# Patient Record
Sex: Female | Born: 1950 | Race: White | Hispanic: No | Marital: Married | Smoking: Never smoker
Health system: Southern US, Community
[De-identification: ages and names within clinical notes are randomized; demographics above are authoritative.]

## PROBLEM LIST (undated history)

## (undated) DIAGNOSIS — M1990 Unspecified osteoarthritis, unspecified site: Secondary | ICD-10-CM

## (undated) DIAGNOSIS — T8859XA Other complications of anesthesia, initial encounter: Secondary | ICD-10-CM

## (undated) DIAGNOSIS — F32A Depression, unspecified: Secondary | ICD-10-CM

## (undated) DIAGNOSIS — T4145XA Adverse effect of unspecified anesthetic, initial encounter: Secondary | ICD-10-CM

## (undated) DIAGNOSIS — R112 Nausea with vomiting, unspecified: Secondary | ICD-10-CM

## (undated) DIAGNOSIS — I10 Essential (primary) hypertension: Secondary | ICD-10-CM

## (undated) DIAGNOSIS — Z9889 Other specified postprocedural states: Secondary | ICD-10-CM

## (undated) DIAGNOSIS — T7840XA Allergy, unspecified, initial encounter: Secondary | ICD-10-CM

## (undated) DIAGNOSIS — G4700 Insomnia, unspecified: Secondary | ICD-10-CM

## (undated) DIAGNOSIS — G43909 Migraine, unspecified, not intractable, without status migrainosus: Secondary | ICD-10-CM

## (undated) DIAGNOSIS — F419 Anxiety disorder, unspecified: Secondary | ICD-10-CM

## (undated) DIAGNOSIS — M797 Fibromyalgia: Secondary | ICD-10-CM

## (undated) DIAGNOSIS — D696 Thrombocytopenia, unspecified: Secondary | ICD-10-CM

## (undated) DIAGNOSIS — F329 Major depressive disorder, single episode, unspecified: Secondary | ICD-10-CM

## (undated) HISTORY — PX: TONSILLECTOMY: SUR1361

## (undated) HISTORY — DX: Unspecified osteoarthritis, unspecified site: M19.90

## (undated) HISTORY — PX: BREAST ENHANCEMENT SURGERY: SHX7

## (undated) HISTORY — PX: VAGINAL HYSTERECTOMY: SUR661

## (undated) HISTORY — DX: Major depressive disorder, single episode, unspecified: F32.9

## (undated) HISTORY — PX: JOINT REPLACEMENT: SHX530

## (undated) HISTORY — DX: Anxiety disorder, unspecified: F41.9

## (undated) HISTORY — DX: Migraine, unspecified, not intractable, without status migrainosus: G43.909

## (undated) HISTORY — DX: Allergy, unspecified, initial encounter: T78.40XA

## (undated) HISTORY — DX: Depression, unspecified: F32.A

## (undated) HISTORY — PX: ESOPHAGOGASTRODUODENOSCOPY ENDOSCOPY: SHX5814

## (undated) HISTORY — PX: AUGMENTATION MAMMAPLASTY: SUR837

## (undated) HISTORY — DX: Insomnia, unspecified: G47.00

---

## 1989-06-14 HISTORY — PX: AUGMENTATION MAMMAPLASTY: SUR837

## 1998-12-10 ENCOUNTER — Ambulatory Visit (HOSPITAL_COMMUNITY): Admission: RE | Admit: 1998-12-10 | Discharge: 1998-12-10 | Payer: Self-pay | Admitting: Family Medicine

## 1998-12-10 ENCOUNTER — Encounter: Payer: Self-pay | Admitting: Family Medicine

## 1999-08-13 ENCOUNTER — Other Ambulatory Visit: Admission: RE | Admit: 1999-08-13 | Discharge: 1999-08-13 | Payer: Self-pay | Admitting: Obstetrics and Gynecology

## 2000-03-18 ENCOUNTER — Encounter (INDEPENDENT_AMBULATORY_CARE_PROVIDER_SITE_OTHER): Payer: Self-pay | Admitting: Specialist

## 2000-03-18 ENCOUNTER — Ambulatory Visit (HOSPITAL_COMMUNITY): Admission: RE | Admit: 2000-03-18 | Discharge: 2000-03-18 | Payer: Self-pay | Admitting: Gastroenterology

## 2000-11-22 ENCOUNTER — Ambulatory Visit (HOSPITAL_COMMUNITY): Admission: RE | Admit: 2000-11-22 | Discharge: 2000-11-22 | Payer: Self-pay | Admitting: Family Medicine

## 2000-11-22 ENCOUNTER — Other Ambulatory Visit: Admission: RE | Admit: 2000-11-22 | Discharge: 2000-11-22 | Payer: Self-pay | Admitting: Family Medicine

## 2000-11-22 ENCOUNTER — Encounter: Payer: Self-pay | Admitting: Family Medicine

## 2002-02-06 ENCOUNTER — Emergency Department (HOSPITAL_COMMUNITY): Admission: EM | Admit: 2002-02-06 | Discharge: 2002-02-06 | Payer: Self-pay | Admitting: *Deleted

## 2002-07-18 ENCOUNTER — Encounter: Admission: RE | Admit: 2002-07-18 | Discharge: 2002-07-18 | Payer: Self-pay | Admitting: *Deleted

## 2004-02-18 DIAGNOSIS — K5730 Diverticulosis of large intestine without perforation or abscess without bleeding: Secondary | ICD-10-CM | POA: Insufficient documentation

## 2004-02-18 HISTORY — PX: COLONOSCOPY: SHX174

## 2004-06-12 ENCOUNTER — Ambulatory Visit: Payer: Self-pay | Admitting: Family Medicine

## 2006-05-09 ENCOUNTER — Other Ambulatory Visit: Admission: RE | Admit: 2006-05-09 | Discharge: 2006-05-09 | Payer: Self-pay | Admitting: *Deleted

## 2007-03-24 ENCOUNTER — Ambulatory Visit: Payer: Self-pay | Admitting: Gastroenterology

## 2007-04-07 ENCOUNTER — Ambulatory Visit: Payer: Self-pay | Admitting: Gastroenterology

## 2007-04-07 DIAGNOSIS — K222 Esophageal obstruction: Secondary | ICD-10-CM

## 2007-04-07 DIAGNOSIS — K229 Disease of esophagus, unspecified: Secondary | ICD-10-CM | POA: Insufficient documentation

## 2007-08-24 DIAGNOSIS — K259 Gastric ulcer, unspecified as acute or chronic, without hemorrhage or perforation: Secondary | ICD-10-CM | POA: Insufficient documentation

## 2008-01-23 ENCOUNTER — Encounter: Admission: RE | Admit: 2008-01-23 | Discharge: 2008-01-23 | Payer: Self-pay | Admitting: Family Medicine

## 2008-08-30 ENCOUNTER — Encounter: Admission: RE | Admit: 2008-08-30 | Discharge: 2008-08-30 | Payer: Self-pay | Admitting: Family Medicine

## 2009-04-03 ENCOUNTER — Encounter: Admission: RE | Admit: 2009-04-03 | Discharge: 2009-04-03 | Payer: Self-pay | Admitting: Family Medicine

## 2010-10-27 NOTE — Assessment & Plan Note (Signed)
[facility]                         GASTROENTEROLOGY OFFICE NOTE   NAME:Laura Sanders, Laura Sanders                          MRN:          [id number]  DATE:03/24/2007                            DOB:          [birth year]    REFERRING PHYSICIAN:  [name]. Artis Flock, M.D.   REASON FOR CONSULTATION:  Dysphagia.   Laura Sanders is a pleasant, 60 year old white female, referred through the  courtesy of Dr. Artis Flock for evaluation.  For the last six months, she has  noted dysphagia to solids.  She feels like her throat tightens up and  she has some discomfort with swallowing.  She is also complaining of  pyrosis.  She is taking Tums and Rolaids with relief.   PAST MEDICAL HISTORY:  Remarkable for diverticulosis.  She is status  post hysterectomy and appendectomy.   FAMILY HISTORY:  Positive for a sister with colon polyps.   MEDICATIONS INCLUDE:  Xanax, Allegra and Celexa.   She has no allergies.   She neither smokes nor drinks.  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   REVIEW OF SYSTEMS:  Positive for some fatigue.   PHYSICAL EXAMINATION:  Pulse 72, blood pressure 98/62, weight 134.  HEENT: EOMI.  PERRLA.  Sclerae are anicteric.  Conjunctivae are pink.  NECK:  Supple without thyromegaly, adenopathy or carotid bruits.  CHEST:  Clear to auscultation and percussion without adventitious  sounds.  CARDIAC:  Regular rhythm; normal S1 S2.  There are no murmurs, gallops  or rubs.  ABDOMEN:  Bowel sounds are normoactive.  Abdomen is soft, nontender and  nondistended.  There are no abdominal masses, tenderness, splenic  enlargement or hepatomegaly.  EXTREMITIES:  Full range of motion.  No cyanosis, clubbing or edema.  RECTAL:  Deferred.   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   RECOMMENDATION:  1. Begin Protonix 40 mg a day.  2. Upper endoscopy with dilatation as indicated.     Barbette Hair. Arlyce Dice, MD,FACG  Electronically Signed    RDK/MedQ   DD: 03/24/2007  DT: 03/24/2007  Job #: [id number]   cc:   [name]. Artis Flock, M.D.

## 2010-10-27 NOTE — Letter (Signed)
March 24, 2007    [name]. Artis Flock, M.D.  [address]  [place], [place]. [address]   RE:  HEBA, IGE  MRN:  [id number]  /  DOB:  August 23, 1950   Dear Dr. Artis Flock:   Upon your kind referral, I had the pleasure of evaluating your patient  and I am pleased to offer my findings.  I saw Laura Sanders in the office  today.  Enclosed is a copy of my progress note that details my findings  and recommendations.   Thank you for the opportunity to participate in your patient's care.    Sincerely,      Barbette Hair. Arlyce Dice, MD,FACG  Electronically Signed    RDK/MedQ  DD: 03/24/2007  DT: 03/24/2007  Job #: [id number]

## 2010-10-30 NOTE — Procedures (Signed)
[facility]  Patient:    Laura Sanders, Laura Sanders                        MRN: [id number] Proc. Date: 03/18/00 Adm. Date:  [id number] Attending:  Judeth Cornfield                           Procedure Report  PROCEDURE:  Upper endoscopy.  SURGEON:  Barbette Hair. Arlyce Dice, M.D.  INDICATIONS:  The patient has had persistent severe mid epigastric pain with radiation to both sides.  She is on no gastric irritants.  History is notable for a superficial gastric ulcer diagnosed in 1998.  She is not improved with empiric therapy with Axid.  INFORMED CONSENT:  The patient provided consent after risks, benefits, and alternatives were explained.  MEDICATIONS:  Versed 7, fentanyl 75, and Robinul 0.2 mg IV, and Cetacaine spray.  DESCRIPTION OF PROCEDURE:  The patient was placed in the left lateral decubitus position, administered continuous low flow oxygen, and was placed on pulse oximetry.  The Olympus video gastroscope was inserted under direct vision into the oropharynx and esophagus.  FINDINGS: 1. In the gastric antrum, there were multiple superficial erosions.  Biopsies    were taken.  Proximal stomach was normal. 2. Normal esophagus and duodenum.  IMPRESSION:  Erosive gastritis.  RECOMMENDATIONS:  Discontinue Axid.  Begin Protonix 40 mg a day. DD:  03/18/00 TD:  03/19/00 Job: [id number]

## 2010-11-11 ENCOUNTER — Other Ambulatory Visit (HOSPITAL_COMMUNITY): Payer: Self-pay | Admitting: Orthopedic Surgery

## 2010-11-11 DIAGNOSIS — M542 Cervicalgia: Secondary | ICD-10-CM

## 2010-11-12 ENCOUNTER — Ambulatory Visit (HOSPITAL_COMMUNITY)
Admission: RE | Admit: 2010-11-12 | Discharge: 2010-11-12 | Disposition: A | Discharge status: Home / Self Care | Payer: No Typology Code available for payment source | Source: Ambulatory Visit | Attending: Orthopedic Surgery | Admitting: Orthopedic Surgery

## 2010-11-12 DIAGNOSIS — M47812 Spondylosis without myelopathy or radiculopathy, cervical region: Secondary | ICD-10-CM | POA: Insufficient documentation

## 2010-11-12 DIAGNOSIS — M25519 Pain in unspecified shoulder: Secondary | ICD-10-CM | POA: Insufficient documentation

## 2010-11-12 DIAGNOSIS — M5090 Cervical disc disorder, unspecified, unspecified cervical region: Secondary | ICD-10-CM | POA: Insufficient documentation

## 2010-11-12 DIAGNOSIS — M5124 Other intervertebral disc displacement, thoracic region: Secondary | ICD-10-CM | POA: Insufficient documentation

## 2010-11-12 DIAGNOSIS — M542 Cervicalgia: Secondary | ICD-10-CM

## 2010-11-20 ENCOUNTER — Institutional Professional Consult (permissible substitution): Payer: Self-pay | Admitting: Internal Medicine

## 2011-02-24 ENCOUNTER — Encounter: Payer: Self-pay | Admitting: Gastroenterology

## 2011-08-20 ENCOUNTER — Emergency Department: Payer: Self-pay | Admitting: Emergency Medicine

## 2011-08-20 LAB — URINALYSIS, COMPLETE
Blood: NEGATIVE
Ketone: NEGATIVE
Ph: 7 (ref 4.5–8.0)
Protein: NEGATIVE
RBC,UR: 1 /HPF (ref 0–5)

## 2011-08-20 LAB — COMPREHENSIVE METABOLIC PANEL
Anion Gap: 9 (ref 7–16)
Bilirubin,Total: 0.6 mg/dL (ref 0.2–1.0)
Chloride: 102 mmol/L (ref 98–107)
Co2: 27 mmol/L (ref 21–32)
Creatinine: 0.81 mg/dL (ref 0.60–1.30)
EGFR (African American): >60
EGFR (Non-African Amer.): >60
Osmolality: 274 (ref 275–301)
Potassium: 3.7 mmol/L (ref 3.5–5.1)
SGPT (ALT): 16 U/L
Sodium: 138 mmol/L (ref 136–145)
Total Protein: 7.4 g/dL (ref 6.4–8.2)

## 2011-08-20 LAB — CBC
HCT: 39.9 % (ref 35.0–47.0)
HGB: 13.6 g/dL (ref 12.0–16.0)
MCH: 30.7 pg (ref 26.0–34.0)
MCHC: 34.1 g/dL (ref 32.0–36.0)
MCV: 90 fL (ref 80–100)
RBC: 4.43 10*6/uL (ref 3.80–5.20)

## 2011-08-20 LAB — DRUG SCREEN, URINE
Amphetamines, Ur Screen: NEGATIVE (ref ?–1000)
Benzodiazepine, Ur Scrn: NEGATIVE (ref ?–200)
Methadone, Ur Screen: NEGATIVE (ref ?–300)
Phencyclidine (PCP) Ur S: NEGATIVE (ref ?–25)

## 2011-08-20 LAB — CK TOTAL AND CKMB (NOT AT ARMC): CK-MB: <0.5 ng/mL — ABNORMAL LOW (ref 0.5–3.6)

## 2011-08-20 LAB — TROPONIN I: Troponin-I: 0.03 ng/mL

## 2011-09-02 ENCOUNTER — Other Ambulatory Visit: Payer: Self-pay | Admitting: Unknown Physician Specialty

## 2011-09-02 DIAGNOSIS — Z1231 Encounter for screening mammogram for malignant neoplasm of breast: Secondary | ICD-10-CM

## 2011-09-15 ENCOUNTER — Ambulatory Visit: Payer: No Typology Code available for payment source

## 2012-03-09 ENCOUNTER — Encounter: Payer: Self-pay | Admitting: Gastroenterology

## 2012-12-07 ENCOUNTER — Other Ambulatory Visit: Payer: Self-pay

## 2012-12-11 ENCOUNTER — Other Ambulatory Visit: Payer: Self-pay

## 2012-12-11 DIAGNOSIS — Z1231 Encounter for screening mammogram for malignant neoplasm of breast: Secondary | ICD-10-CM

## 2013-01-18 ENCOUNTER — Ambulatory Visit
Admission: RE | Admit: 2013-01-18 | Discharge: 2013-01-18 | Disposition: A | Discharge status: Home / Self Care | Payer: No Typology Code available for payment source | Source: Ambulatory Visit

## 2013-01-18 DIAGNOSIS — Z1231 Encounter for screening mammogram for malignant neoplasm of breast: Secondary | ICD-10-CM

## 2013-01-24 ENCOUNTER — Other Ambulatory Visit: Payer: Self-pay | Admitting: Family Medicine

## 2013-01-24 DIAGNOSIS — N644 Mastodynia: Secondary | ICD-10-CM

## 2013-01-29 ENCOUNTER — Encounter: Payer: Self-pay | Admitting: Gastroenterology

## 2013-03-07 ENCOUNTER — Ambulatory Visit
Admission: RE | Admit: 2013-03-07 | Discharge: 2013-03-07 | Disposition: A | Discharge status: Home / Self Care | Payer: No Typology Code available for payment source | Source: Ambulatory Visit | Attending: Family Medicine | Admitting: Family Medicine

## 2013-03-07 ENCOUNTER — Other Ambulatory Visit: Payer: Self-pay | Admitting: Family Medicine

## 2013-03-07 DIAGNOSIS — N644 Mastodynia: Secondary | ICD-10-CM

## 2013-03-15 ENCOUNTER — Ambulatory Visit
Admission: RE | Admit: 2013-03-15 | Discharge: 2013-03-15 | Disposition: A | Discharge status: Home / Self Care | Payer: No Typology Code available for payment source | Source: Ambulatory Visit | Attending: Family Medicine | Admitting: Family Medicine

## 2013-03-15 ENCOUNTER — Other Ambulatory Visit: Payer: Self-pay | Admitting: Family Medicine

## 2013-03-15 DIAGNOSIS — N644 Mastodynia: Secondary | ICD-10-CM

## 2013-03-15 HISTORY — PX: BREAST BIOPSY: SHX20

## 2013-04-04 ENCOUNTER — Telehealth: Payer: Self-pay | Admitting: *Deleted

## 2013-04-04 NOTE — Telephone Encounter (Signed)
Pts last colonoscopy was on 02/18/2004, Diverticulosis was the only things noted, recall around 2012 on report.The recall assessment sheet states recall in 2015. Pt is scheduled for colonoscopy on 04/20/13. Is it okay to do colonoscopy now or does pt need to wait til next year (2015)? Please advise-adm

## 2013-04-05 NOTE — Telephone Encounter (Signed)
Pt notified that recall colon not due until 2015.  Colonoscopy scheduled for 11/7 with Dr. Arlyce Dice cancelled.  Recall for 2015 entered into computer.

## 2013-04-05 NOTE — Telephone Encounter (Signed)
2015  

## 2013-04-20 ENCOUNTER — Encounter: Payer: No Typology Code available for payment source | Admitting: Gastroenterology

## 2013-12-25 ENCOUNTER — Encounter: Payer: Self-pay | Admitting: Gastroenterology

## 2014-02-15 ENCOUNTER — Ambulatory Visit: Payer: Self-pay | Admitting: Internal Medicine

## 2014-02-27 DIAGNOSIS — M542 Cervicalgia: Secondary | ICD-10-CM | POA: Insufficient documentation

## 2014-05-08 ENCOUNTER — Other Ambulatory Visit: Payer: Self-pay | Admitting: Internal Medicine

## 2014-05-08 DIAGNOSIS — N6320 Unspecified lump in the left breast, unspecified quadrant: Secondary | ICD-10-CM

## 2014-05-28 ENCOUNTER — Other Ambulatory Visit: Payer: Self-pay

## 2014-05-28 DIAGNOSIS — Z1231 Encounter for screening mammogram for malignant neoplasm of breast: Secondary | ICD-10-CM

## 2014-07-02 ENCOUNTER — Ambulatory Visit
Admission: RE | Admit: 2014-07-02 | Discharge: 2014-07-02 | Disposition: A | Discharge status: Home / Self Care | Payer: No Typology Code available for payment source | Source: Ambulatory Visit

## 2014-07-02 DIAGNOSIS — Z1231 Encounter for screening mammogram for malignant neoplasm of breast: Secondary | ICD-10-CM

## 2014-09-15 ENCOUNTER — Encounter: Payer: Self-pay | Admitting: Gastroenterology

## 2015-04-15 ENCOUNTER — Other Ambulatory Visit: Payer: Self-pay | Admitting: Internal Medicine

## 2015-04-15 DIAGNOSIS — R1084 Generalized abdominal pain: Secondary | ICD-10-CM

## 2015-04-15 DIAGNOSIS — E7800 Pure hypercholesterolemia, unspecified: Secondary | ICD-10-CM | POA: Insufficient documentation

## 2015-04-15 DIAGNOSIS — D696 Thrombocytopenia, unspecified: Secondary | ICD-10-CM | POA: Insufficient documentation

## 2015-04-15 DIAGNOSIS — Z1231 Encounter for screening mammogram for malignant neoplasm of breast: Secondary | ICD-10-CM

## 2015-04-21 ENCOUNTER — Ambulatory Visit
Admission: RE | Admit: 2015-04-21 | Discharge: 2015-04-21 | Disposition: A | Discharge status: Home / Self Care | Payer: No Typology Code available for payment source | Source: Ambulatory Visit | Attending: Internal Medicine | Admitting: Internal Medicine

## 2015-04-21 DIAGNOSIS — R1084 Generalized abdominal pain: Secondary | ICD-10-CM

## 2015-04-24 ENCOUNTER — Ambulatory Visit (AMBULATORY_SURGERY_CENTER): Payer: Self-pay | Admitting: *Deleted

## 2015-04-24 VITALS — Ht 64.0 in | Wt 142.2 lb

## 2015-04-24 DIAGNOSIS — Z1211 Encounter for screening for malignant neoplasm of colon: Secondary | ICD-10-CM

## 2015-04-24 MED ORDER — SUPREP BOWEL PREP KIT 17.5-3.13-1.6 GM/177ML PO SOLN: 1.0000 | Freq: Once | ORAL | Status: DC

## 2015-04-24 NOTE — Progress Notes (Signed)
Patient denies any allergies to egg or soy products. Patient denies complications with anesthesia/sedation.  Patient denies oxygen use at home and denies diet medications. Emmi instructions for colonoscopy explained but patient refused.   

## 2015-04-25 ENCOUNTER — Inpatient Hospital Stay: Payer: No Typology Code available for payment source | Attending: Oncology | Admitting: Oncology

## 2015-04-25 ENCOUNTER — Inpatient Hospital Stay: Payer: No Typology Code available for payment source

## 2015-04-25 VITALS — BP 137/74 | HR 73 | Temp 96.8°F | Resp 20 | Wt 143.3 lb

## 2015-04-25 DIAGNOSIS — D696 Thrombocytopenia, unspecified: Secondary | ICD-10-CM | POA: Diagnosis not present

## 2015-04-25 DIAGNOSIS — Z79899 Other long term (current) drug therapy: Secondary | ICD-10-CM | POA: Diagnosis not present

## 2015-04-25 DIAGNOSIS — F418 Other specified anxiety disorders: Secondary | ICD-10-CM | POA: Diagnosis not present

## 2015-04-25 DIAGNOSIS — M170 Bilateral primary osteoarthritis of knee: Secondary | ICD-10-CM | POA: Diagnosis not present

## 2015-04-25 DIAGNOSIS — M19042 Primary osteoarthritis, left hand: Secondary | ICD-10-CM | POA: Diagnosis not present

## 2015-04-25 DIAGNOSIS — R109 Unspecified abdominal pain: Secondary | ICD-10-CM | POA: Diagnosis not present

## 2015-04-25 DIAGNOSIS — M19041 Primary osteoarthritis, right hand: Secondary | ICD-10-CM

## 2015-04-25 LAB — LACTATE DEHYDROGENASE: LDH: 131 U/L (ref 98–192)

## 2015-04-25 LAB — FERRITIN: Ferritin: 33 ng/mL (ref 11–307)

## 2015-04-25 LAB — IRON AND TIBC
Iron: 93 ug/dL (ref 28–170)
SATURATION RATIOS: 28 % (ref 10.4–31.8)
TIBC: 336 ug/dL (ref 250–450)
UIBC: 243 ug/dL

## 2015-04-25 LAB — FOLATE: Folate: 16.2 ng/mL (ref >5.9)

## 2015-04-25 NOTE — Progress Notes (Signed)
Patient here today for initial evaluation regarding thrombocytopenia, referred by Dr. Judithann SheenSparks.

## 2015-04-26 LAB — ANA W/REFLEX: Anti Nuclear Antibody(ANA): NEGATIVE

## 2015-04-26 LAB — PLATELET ANTIBODY PROFILE, SERUM
HLA Ab Ser Ql EIA: NEGATIVE
IA/IIA ANTIBODY: NEGATIVE
IB/IX ANTIBODY: NEGATIVE
IIB/IIIA Antibody: NEGATIVE

## 2015-04-26 LAB — VITAMIN B12: Vitamin B-12: 266 pg/mL (ref 180–914)

## 2015-04-28 LAB — PROTEIN ELECTROPHORESIS, SERUM
A/G RATIO SPE: 1.1 (ref 0.7–1.7)
ALBUMIN ELP: 3.6 g/dL (ref 2.9–4.4)
Alpha-1-Globulin: 0.3 g/dL (ref 0.0–0.4)
Alpha-2-Globulin: 0.7 g/dL (ref 0.4–1.0)
Beta Globulin: 1.1 g/dL (ref 0.7–1.3)
GAMMA GLOBULIN: 1.2 g/dL (ref 0.4–1.8)
Globulin, Total: 3.3 g/dL (ref 2.2–3.9)
TOTAL PROTEIN ELP: 6.9 g/dL (ref 6.0–8.5)

## 2015-05-01 ENCOUNTER — Encounter: Payer: Self-pay | Admitting: Gastroenterology

## 2015-05-07 ENCOUNTER — Ambulatory Visit (AMBULATORY_SURGERY_CENTER): Payer: No Typology Code available for payment source | Admitting: Gastroenterology

## 2015-05-07 ENCOUNTER — Encounter: Payer: Self-pay | Admitting: Gastroenterology

## 2015-05-07 VITALS — BP 154/67 | HR 74 | Temp 97.0°F | Resp 14 | Ht 64.0 in | Wt 142.0 lb

## 2015-05-07 DIAGNOSIS — F329 Major depressive disorder, single episode, unspecified: Secondary | ICD-10-CM | POA: Insufficient documentation

## 2015-05-07 DIAGNOSIS — M1710 Unilateral primary osteoarthritis, unspecified knee: Secondary | ICD-10-CM | POA: Insufficient documentation

## 2015-05-07 DIAGNOSIS — F32A Depression, unspecified: Secondary | ICD-10-CM | POA: Insufficient documentation

## 2015-05-07 DIAGNOSIS — Z1211 Encounter for screening for malignant neoplasm of colon: Secondary | ICD-10-CM

## 2015-05-07 DIAGNOSIS — M1711 Unilateral primary osteoarthritis, right knee: Secondary | ICD-10-CM | POA: Insufficient documentation

## 2015-05-07 DIAGNOSIS — N3010 Interstitial cystitis (chronic) without hematuria: Secondary | ICD-10-CM | POA: Insufficient documentation

## 2015-05-07 DIAGNOSIS — Z8711 Personal history of peptic ulcer disease: Secondary | ICD-10-CM | POA: Insufficient documentation

## 2015-05-07 DIAGNOSIS — K6389 Other specified diseases of intestine: Secondary | ICD-10-CM | POA: Diagnosis not present

## 2015-05-07 DIAGNOSIS — Z8719 Personal history of other diseases of the digestive system: Secondary | ICD-10-CM | POA: Insufficient documentation

## 2015-05-07 DIAGNOSIS — Z8669 Personal history of other diseases of the nervous system and sense organs: Secondary | ICD-10-CM | POA: Insufficient documentation

## 2015-05-07 MED ORDER — SODIUM CHLORIDE 0.9 % IV SOLN: 500.0000 mL | INTRAVENOUS | Status: DC

## 2015-05-07 NOTE — Progress Notes (Signed)
A/ox3 pleased with MAC, report to Wendy RN 

## 2015-05-07 NOTE — Op Note (Signed)
[facility] [address] [place], [id number]   COLONOSCOPY PROCEDURE REPORT  PATIENT: Marcelino DusterJones, Fatime  MR#: [id number] BIRTHDATE: Jan 31, 1951 , 64  yrs. old GENDER: female ENDOSCOPIST: Marsa ArisKavitha Pearlee Arvizu, MD REFERRED XB:[id number]:Jeffrey Sparks, M.D. PROCEDURE DATE:  05/07/2015 PROCEDURE:   Colonoscopy, screening First Screening Colonoscopy - Avg.  risk and is 50 yrs.  old or older - No.  Prior Negative Screening - Now for repeat screening. 10 or more years since last screening  History of Adenoma - Now for follow-up colonoscopy & has been > or = to 3 yrs.  N/A  Polyps removed today? No ASA CLASS:   Class II INDICATIONS:Screening for colonic neoplasia and Colorectal Neoplasm Risk Assessment for this procedure is average risk. MEDICATIONS: Propofol 300 mg IV  DESCRIPTION OF PROCEDURE:   After the risks benefits and alternatives of the procedure were thoroughly explained, informed consent was obtained.  The digital rectal exam revealed no abnormalities of the rectum.   The LB [id number]-H190 [id number]  endoscope was introduced through the anus and advanced to the terminal ileum which was intubated for a short distance. No adverse events experienced.   The quality of the prep was good.  The instrument was then slowly withdrawn as the colon was fully examined. Estimated blood loss is zero unless otherwise noted in this procedure report.   COLON FINDINGS: The examined terminal ileum appeared to be normal. There was moderate diverticulosis noted in the sigmoid colon with associated petechiae and muscular hypertrophy.  Retroflexion was performed, due to a narrow rectal vault did not get a good retroflexed view. The time to cecum = 12.2 Withdrawal time = 9.8 The scope was withdrawn and the procedure completed. COMPLICATIONS: There were no immediate complications.  ENDOSCOPIC IMPRESSION: 1.   The examined terminal ileum appeared to be normal 2.   There was moderate diverticulosis noted in  the sigmoid colon  RECOMMENDATIONS: 1.  You should continue to follow colorectal cancer screening guidelines for "routine risk" patients with a repeat colonoscopy in 10 years.  There is no need for FOBT (stool) testing for at least 5 years. 2.  Await biopsy results  eSigned:  Marsa ArisKavitha Janella Rogala, MD 05/07/2015 9:38 AM

## 2015-05-07 NOTE — Patient Instructions (Signed)
YOU HAD AN ENDOSCOPIC PROCEDURE TODAY AT THE [facility]:   Refer to the procedure report that was given to you for any specific questions about what was found during the examination.  If the procedure report does not answer your questions, please call your gastroenterologist to clarify.  If you requested that your care partner not be given the details of your procedure findings, then the procedure report has been included in a sealed envelope for you to review at your convenience later.  YOU SHOULD EXPECT: Some feelings of bloating in the abdomen. Passage of more gas than usual.  Walking can help get rid of the air that was put into your GI tract during the procedure and reduce the bloating. If you had a lower endoscopy (such as a colonoscopy or flexible sigmoidoscopy) you may notice spotting of blood in your stool or on the toilet paper. If you underwent a bowel prep for your procedure, you may not have a normal bowel movement for a few days.  Please Note:  You might notice some irritation and congestion in your nose or some drainage.  This is from the oxygen used during your procedure.  There is no need for concern and it should clear up in a day or so.  SYMPTOMS TO REPORT IMMEDIATELY:   Following lower endoscopy (colonoscopy or flexible sigmoidoscopy):  Excessive amounts of blood in the stool  Significant tenderness or worsening of abdominal pains  Swelling of the abdomen that is new, acute  Fever of 100F or higher  For urgent or emergent issues, a gastroenterologist can be reached at any hour by calling [phone].   DIET: Your first meal following the procedure should be a small meal and then it is ok to progress to your normal diet. Heavy or fried foods are harder to digest and may make you feel nauseous or bloated.  Likewise, meals heavy in dairy and vegetables can increase bloating.  Drink plenty of fluids but you should avoid alcoholic beverages for 24  hours.  ACTIVITY:  You should plan to take it easy for the rest of today and you should NOT DRIVE or use heavy machinery until tomorrow (because of the sedation medicines used during the test).    FOLLOW UP: Our staff will call the number listed on your records the next business day following your procedure to check on you and address any questions or concerns that you may have regarding the information given to you following your procedure. If we do not reach you, we will leave a message.  However, if you are feeling well and you are not experiencing any problems, there is no need to return our call.  We will assume that you have returned to your regular daily activities without incident.  If any biopsies were taken you will be contacted by phone or by letter within the next 1-3 weeks.  Please call us at [phone] if you have not heard about the biopsies in 3 weeks.    SIGNATURES/CONFIDENTIALITY: You and/or your care partner have signed paperwork which will be entered into your electronic medical record.  These signatures attest to the fact that that the information above on your After Visit Summary has been reviewed and is understood.  Full responsibility of the confidentiality of this discharge information lies with you and/or your care-partner.  Await biopsy results. Next colonoscopy in 10 years. Please review diverticulosis and high fiber diet handouts provided.

## 2015-05-07 NOTE — Progress Notes (Signed)
Called to room to assist during endoscopic procedure.  Patient ID and intended procedure confirmed with present staff. Received instructions for my participation in the procedure from the performing physician.  

## 2015-05-07 NOTE — Progress Notes (Signed)
Patient denies any allergies to eggs or soy. 

## 2015-05-12 ENCOUNTER — Telehealth: Payer: Self-pay | Admitting: *Deleted

## 2015-05-12 NOTE — Telephone Encounter (Signed)
  Follow up Call-  Call back number 05/07/2015  Post procedure Call Back phone  # [phone]  Permission to leave phone message Yes     Patient questions:  Do you have a fever, pain , or abdominal swelling? No. Pain Score  0 *  Have you tolerated food without any problems? Yes.    Have you been able to return to your normal activities? Yes.    Do you have any questions about your discharge instructions: Diet   No. Medications  No. Follow up visit  No.  Do you have questions or concerns about your Care? No.  Actions: * If pain score is 4 or above: No action needed, pain <4.

## 2015-05-13 NOTE — Progress Notes (Signed)
[place]  [phone] [phone]) [phone]  ID: Laura Sanders OB: [birth year]  MR#: [id number]  NOB#:[id number]  Patient Care Team: Idelle Crouch, MD as PCP - General (Internal Medicine)  CHIEF COMPLAINT:  Chief Complaint  Patient presents with  . New Evaluation    thrombocytopenia    INTERVAL HISTORY:  Patient is a 64 year old female who was found to have a decreased platelet count on routine blood work. She currently feels well and is asymptomatic. She denies any easy bleeding or bruising. She has no neurologic complaint. She denies any recent fevers or illnesses. She has a good appetite and denies weight loss. She has no chest pain or shortness of breath. She denies any nausea, vomiting, constipation, or diarrhea. She has no urinary complaints. Patient feels at her baseline and offers no specific complaints today.  REVIEW OF SYSTEMS:   Review of Systems  Constitutional: Negative.  Negative for fever, weight loss and malaise/fatigue.  Respiratory: Negative.   Cardiovascular: Negative.   Gastrointestinal: Negative.   Musculoskeletal: Negative.   Neurological: Negative.  Negative for weakness.  Endo/Heme/Allergies: Does not bruise/bleed easily.    As per HPI. Otherwise, a complete review of systems is negatve.  PAST MEDICAL HISTORY: Past Medical History  Diagnosis Date  . Allergy   . Anxiety   . Depression   . Insomnia   . Migraines   . Arthritis     hands, knee    PAST SURGICAL HISTORY: Past Surgical History  Procedure Laterality Date  . Vaginal hysterectomy    . Tonsillectomy    . Breast enhancement surgery    . Esophagogastroduodenoscopy endoscopy    . Colonoscopy  02/18/2004    kaplan -normal    FAMILY HISTORY Family History  Problem Relation Age of Onset  . Colon polyps Sister   . Stomach cancer Paternal Aunt   . Colon cancer Neg Hx   . Esophageal cancer Neg Hx   . Rectal cancer Neg Hx        ADVANCED DIRECTIVES:     HEALTH MAINTENANCE: Social History  Substance Use Topics  . Smoking status: Never Smoker   . Smokeless tobacco: Never Used  . Alcohol Use: No     Colonoscopy:  PAP:  Bone density:  Lipid panel:  Allergies  Allergen Reactions  . Bupropion Other (See Comments)    Makes patient nervous    Current Outpatient Prescriptions  Medication Sig Dispense Refill  . celecoxib (CELEBREX) 200 MG capsule Take 200 mg by mouth 3 (three) times a week.    . cholecalciferol (VITAMIN D) 1000 UNITS tablet Take 1,000 Units by mouth daily.    Marland Kitchen estradiol (ESTRACE) 2 MG tablet TAKE 1 TABLET BY MOUTH AFTER DOUCHE    . montelukast (SINGULAIR) 10 MG tablet TAKE 1 TABLET BY MOUTH EVERY DAY    . traZODone (DESYREL) 100 MG tablet Take 100 mg by mouth at bedtime.    Marland Kitchen venlafaxine XR (EFFEXOR-XR) 75 MG 24 hr capsule TAKE 3 CAPSULES BY MOUTH EVERY DAY    . ALPRAZolam (XANAX) 0.5 MG tablet Take 0.5 mg by mouth every 8 (eight) hours as needed.  3   No current facility-administered medications for this visit.    OBJECTIVE: Filed Vitals:   04/25/15 1409  BP: 137/74  Pulse: 73  Temp: 96.8 F (36 C)  Resp: 20     Body mass index is 24.59 kg/(m^2).    ECOG FS:0 - Asymptomatic  General: Well-developed, well-nourished, no acute distress. Eyes:  Pink conjunctiva, anicteric sclera. HEENT: Normocephalic, moist mucous membranes, clear oropharnyx. Lungs: Clear to auscultation bilaterally. Heart: Regular rate and rhythm. No rubs, murmurs, or gallops. Abdomen: Soft, nontender, nondistended. No organomegaly noted, normoactive bowel sounds. Musculoskeletal: No edema, cyanosis, or clubbing. Neuro: Alert, answering all questions appropriately. Cranial nerves grossly intact. Skin: No rashes or petechiae noted. Psych: Normal affect. Lymphatics: No cervical, calvicular, axillary or inguinal LAD.   LAB RESULTS:  Lab Results  Component Value Date   NA 138 08/20/2011   K 3.7 08/20/2011   CL 102 08/20/2011   CO2  27 08/20/2011   GLUCOSE 84 08/20/2011   BUN 9 08/20/2011   CREATININE 0.81 08/20/2011   CALCIUM 8.8 08/20/2011   PROT 7.4 08/20/2011   ALBUMIN 3.6 08/20/2011   AST 16 08/20/2011   ALT 16 08/20/2011   ALKPHOS 46* 08/20/2011   BILITOT 0.6 08/20/2011   GFRNONAA >60 08/20/2011   GFRAA >60 08/20/2011    Lab Results  Component Value Date   WBC 9.2 08/20/2011   HGB 13.6 08/20/2011   HCT 39.9 08/20/2011   MCV 90 08/20/2011   PLT 133* 08/20/2011     STUDIES: US Abdomen Complete  04/21/2015  CLINICAL DATA:  Abdominal pain. EXAM: ULTRASOUND ABDOMEN COMPLETE COMPARISON:  None. FINDINGS: Gallbladder: No gallstones or wall thickening visualized. No sonographic Murphy sign noted. Common bile duct: Diameter: 3.6 mm Liver: No focal lesion identified. Within normal limits in parenchymal echogenicity. IVC: No abnormality visualized. Pancreas: Visualized portion unremarkable. Spleen: Size and appearance within normal limits. Right Kidney: Length: 9.7 cm. Echogenicity within normal limits. No mass. Mild right renal pelvic prominence. No definite hydronephrosis. Left Kidney: Length: 11.1 cm. Echogenicity within normal limits. No mass or hydronephrosis visualized. Abdominal aorta: No aneurysm visualized. Other findings: None. IMPRESSION: No acute or focal abnormality. Electronically Signed   By: Marcello Moores  Register   On: 04/21/2015 07:55    ASSESSMENT:  Thrombocytopenia.  PLAN:    1. Thrombocytopenia: patient's platelet count is decreased 81, but the remainder of her laboratory work including platelet antibodies are either negative or within normal limits. Abdominal ultrasound did not reveal splenomegaly. Is possible she has ITP or developing MDS, but this would take a bone marrow biopsy to diagnosis. This is not necessary at this point. Return to clinic in 2 months with repeat laboratory work and further evaluation.  2. Health maintenance: Patient's colonoscopy on May 07, 2015 was reported as  normal.  Patient expressed understanding and was in agreement with this plan. She also understands that She can call clinic at any time with any questions, concerns, or complaints.   Lloyd Huger, MD   05/13/2015 9:15 AM

## 2015-05-27 ENCOUNTER — Encounter: Payer: Self-pay | Admitting: Gastroenterology

## 2015-06-27 ENCOUNTER — Inpatient Hospital Stay: Payer: No Typology Code available for payment source | Attending: Oncology

## 2015-06-27 ENCOUNTER — Inpatient Hospital Stay (HOSPITAL_BASED_OUTPATIENT_CLINIC_OR_DEPARTMENT_OTHER): Payer: No Typology Code available for payment source | Admitting: Oncology

## 2015-06-27 VITALS — BP 146/91 | HR 83 | Temp 96.4°F | Resp 18 | Wt 135.6 lb

## 2015-06-27 DIAGNOSIS — F418 Other specified anxiety disorders: Secondary | ICD-10-CM | POA: Insufficient documentation

## 2015-06-27 DIAGNOSIS — M1990 Unspecified osteoarthritis, unspecified site: Secondary | ICD-10-CM | POA: Insufficient documentation

## 2015-06-27 DIAGNOSIS — D696 Thrombocytopenia, unspecified: Secondary | ICD-10-CM | POA: Insufficient documentation

## 2015-06-27 DIAGNOSIS — Z800 Family history of malignant neoplasm of digestive organs: Secondary | ICD-10-CM | POA: Diagnosis not present

## 2015-06-27 DIAGNOSIS — Z79899 Other long term (current) drug therapy: Secondary | ICD-10-CM

## 2015-06-27 LAB — CBC WITH DIFFERENTIAL/PLATELET
BASOS ABS: 0 10*3/uL (ref 0–0.1)
BASOS PCT: 1 %
Eosinophils Absolute: 0 10*3/uL (ref 0–0.7)
Eosinophils Relative: 1 %
HEMATOCRIT: 42.2 % (ref 35.0–47.0)
HEMOGLOBIN: 14.3 g/dL (ref 12.0–16.0)
LYMPHS PCT: 21 %
Lymphs Abs: 1.7 10*3/uL (ref 1.0–3.6)
MCH: 29.9 pg (ref 26.0–34.0)
MCHC: 33.9 g/dL (ref 32.0–36.0)
MCV: 88.3 fL (ref 80.0–100.0)
MONO ABS: 0.7 10*3/uL (ref 0.2–0.9)
Monocytes Relative: 8 %
NEUTROS ABS: 5.7 10*3/uL (ref 1.4–6.5)
NEUTROS PCT: 69 %
Platelets: 113 10*3/uL — ABNORMAL LOW (ref 150–440)
RBC: 4.78 MIL/uL (ref 3.80–5.20)
RDW: 12 % (ref 11.5–14.5)
WBC: 8.1 10*3/uL (ref 3.6–11.0)

## 2015-06-27 NOTE — Progress Notes (Signed)
[name]  [phone] [phone]  ID: Laura Sanders OB: 05/14/1951  MR#: [id number]  FUX#:[id number]  Patient Care Team: Idelle Crouch, MD as PCP - General (Internal Medicine)  CHIEF COMPLAINT:  Chief Complaint  Patient presents with  . thrombocytopenia    INTERVAL HISTORY:  Patient returns to clinic today for repeat laboratory work and further evaluation. She continues to feel well and is asymptomatic. She denies any easy bleeding or bruising. She has no neurologic complaints. She denies any recent fevers or illnesses. She has a good appetite and denies weight loss. She has no chest pain or shortness of breath. She denies any nausea, vomiting, constipation, or diarrhea. She has no urinary complaints. Patient offers no specific complaints today.  REVIEW OF SYSTEMS:   Review of Systems  Constitutional: Negative.  Negative for fever, weight loss and malaise/fatigue.  Respiratory: Negative.   Cardiovascular: Negative.   Gastrointestinal: Negative.   Musculoskeletal: Negative.   Neurological: Negative.  Negative for weakness.  Endo/Heme/Allergies: Does not bruise/bleed easily.    As per HPI. Otherwise, a complete review of systems is negatve.  PAST MEDICAL HISTORY: Past Medical History  Diagnosis Date  . Allergy   . Anxiety   . Depression   . Insomnia   . Migraines   . Arthritis     hands, knee    PAST SURGICAL HISTORY: Past Surgical History  Procedure Laterality Date  . Vaginal hysterectomy    . Tonsillectomy    . Breast enhancement surgery    . Esophagogastroduodenoscopy endoscopy    . Colonoscopy  02/18/2004    kaplan -normal    FAMILY HISTORY Family History  Problem Relation Age of Onset  . Colon polyps Sister   . Stomach cancer Paternal Aunt   . Colon cancer Neg Hx   . Esophageal cancer Neg Hx   . Rectal cancer Neg Hx        ADVANCED DIRECTIVES:    HEALTH MAINTENANCE: Social History  Substance Use Topics    . Smoking status: Never Smoker   . Smokeless tobacco: Never Used  . Alcohol Use: No     Colonoscopy:  PAP:  Bone density:  Lipid panel:  Allergies  Allergen Reactions  . Bupropion Other (See Comments)    Makes patient nervous    Current Outpatient Prescriptions  Medication Sig Dispense Refill  . ALPRAZolam (XANAX) 0.5 MG tablet Take 0.5 mg by mouth every 8 (eight) hours as needed.  3  . celecoxib (CELEBREX) 200 MG capsule Take 200 mg by mouth 3 (three) times a week.    . cholecalciferol (VITAMIN D) 1000 UNITS tablet Take 1,000 Units by mouth daily.    Marland Kitchen estradiol (ESTRACE) 2 MG tablet TAKE 1 TABLET BY MOUTH AFTER DOUCHE    . montelukast (SINGULAIR) 10 MG tablet TAKE 1 TABLET BY MOUTH EVERY DAY    . traZODone (DESYREL) 100 MG tablet Take 100 mg by mouth at bedtime.    Marland Kitchen venlafaxine XR (EFFEXOR-XR) 75 MG 24 hr capsule TAKE 3 CAPSULES BY MOUTH EVERY DAY     No current facility-administered medications for this visit.    OBJECTIVE: Filed Vitals:   06/27/15 0957  BP: 146/91  Pulse: 83  Temp: 96.4 F (35.8 C)  Resp: 18     Body mass index is 23.26 kg/(m^2).    ECOG FS:0 - Asymptomatic  General: Well-developed, well-nourished, no acute distress. Eyes: Pink conjunctiva, anicteric sclera. Lungs: Clear to auscultation bilaterally. Heart: Regular rate and rhythm.  No rubs, murmurs, or gallops. Abdomen: Soft, nontender, nondistended. No organomegaly noted, normoactive bowel sounds. Musculoskeletal: No edema, cyanosis, or clubbing. Neuro: Alert, answering all questions appropriately. Cranial nerves grossly intact. Skin: No rashes or petechiae noted. Psych: Normal affect.  LAB RESULTS:  Lab Results  Component Value Date   NA 138 08/20/2011   K 3.7 08/20/2011   CL 102 08/20/2011   CO2 27 08/20/2011   GLUCOSE 84 08/20/2011   BUN 9 08/20/2011   CREATININE 0.81 08/20/2011   CALCIUM 8.8 08/20/2011   PROT 7.4 08/20/2011   ALBUMIN 3.6 08/20/2011   AST 16 08/20/2011   ALT  16 08/20/2011   ALKPHOS 46* 08/20/2011   BILITOT 0.6 08/20/2011   GFRNONAA >60 08/20/2011   GFRAA >60 08/20/2011    Lab Results  Component Value Date   WBC 8.1 06/27/2015   NEUTROABS 5.7 06/27/2015   HGB 14.3 06/27/2015   HCT 42.2 06/27/2015   MCV 88.3 06/27/2015   PLT 113* 06/27/2015     STUDIES: No results found.  ASSESSMENT:  Thrombocytopenia.  PLAN:    1. Thrombocytopenia: Patient's platelet count is increased to 113 today from 81 back in November 2016. The remainder of her laboratory work including platelet antibodies are either negative or within normal limits. Abdominal ultrasound did not reveal splenomegaly. It is possible she has ITP or developing MDS, but this would take a bone marrow biopsy to diagnosis. This is not necessary at this point. Return to clinic in 3 months with repeat laboratory work and then in 6 months with repeat lab work and further evaluation. The patient has requested to go to the [facility] for further appts.  2. Health maintenance: Patient's colonoscopy on May 07, 2015 was reported as normal.  Patient expressed understanding and was in agreement with this plan. She also understands that She can call clinic at any time with any questions, concerns, or complaints.   Mayra Reel, NP   06/27/2015 10:41 AM    Patient was seen and evaluated independently and I agree with the assessment and plan as dictated as above.  Lloyd Huger, MD 06/27/2015 2:55 PM

## 2015-07-04 ENCOUNTER — Ambulatory Visit: Payer: No Typology Code available for payment source

## 2015-09-25 ENCOUNTER — Inpatient Hospital Stay: Payer: No Typology Code available for payment source | Attending: Oncology

## 2015-11-03 ENCOUNTER — Ambulatory Visit
Admission: RE | Admit: 2015-11-03 | Discharge: 2015-11-03 | Disposition: A | Discharge status: Home / Self Care | Payer: No Typology Code available for payment source | Source: Ambulatory Visit | Attending: Internal Medicine | Admitting: Internal Medicine

## 2015-11-03 DIAGNOSIS — Z1231 Encounter for screening mammogram for malignant neoplasm of breast: Secondary | ICD-10-CM

## 2015-11-22 ENCOUNTER — Emergency Department
Admission: EM | Admit: 2015-11-22 | Discharge: 2015-11-22 | Disposition: A | Discharge status: Home / Self Care | Payer: No Typology Code available for payment source | Attending: Emergency Medicine | Admitting: Emergency Medicine

## 2015-11-22 ENCOUNTER — Emergency Department: Payer: No Typology Code available for payment source

## 2015-11-22 ENCOUNTER — Encounter: Payer: Self-pay | Admitting: Emergency Medicine

## 2015-11-22 DIAGNOSIS — F329 Major depressive disorder, single episode, unspecified: Secondary | ICD-10-CM | POA: Diagnosis not present

## 2015-11-22 DIAGNOSIS — Z79899 Other long term (current) drug therapy: Secondary | ICD-10-CM | POA: Insufficient documentation

## 2015-11-22 DIAGNOSIS — M1990 Unspecified osteoarthritis, unspecified site: Secondary | ICD-10-CM | POA: Diagnosis not present

## 2015-11-22 DIAGNOSIS — G43909 Migraine, unspecified, not intractable, without status migrainosus: Secondary | ICD-10-CM | POA: Diagnosis not present

## 2015-11-22 DIAGNOSIS — Z791 Long term (current) use of non-steroidal anti-inflammatories (NSAID): Secondary | ICD-10-CM | POA: Insufficient documentation

## 2015-11-22 MED ORDER — KETOROLAC TROMETHAMINE 30 MG/ML IJ SOLN
30.0000 mg | Freq: Once | INTRAMUSCULAR | Status: AC
  Administered 2015-11-22: 30 mg via INTRAVENOUS
  Filled 2015-11-22: qty 1

## 2015-11-22 MED ORDER — BUTALBITAL-APAP-CAFFEINE 50-325-40 MG PO TABS: 1.0000 | ORAL_TABLET | Freq: Four times a day (QID) | ORAL | Status: AC | PRN

## 2015-11-22 MED ORDER — METOCLOPRAMIDE HCL 5 MG/ML IJ SOLN
20.0000 mg | Freq: Once | INTRAVENOUS | Status: AC
  Administered 2015-11-22: 20 mg via INTRAVENOUS
  Filled 2015-11-22: qty 4

## 2015-11-22 MED ORDER — SODIUM CHLORIDE 0.9 % IV SOLN
1000.0000 mL | Freq: Once | INTRAVENOUS | Status: AC
  Administered 2015-11-22: 1000 mL via INTRAVENOUS

## 2015-11-22 MED ORDER — DIPHENHYDRAMINE HCL 50 MG/ML IJ SOLN
25.0000 mg | Freq: Once | INTRAMUSCULAR | Status: AC
  Administered 2015-11-22: 25 mg via INTRAVENOUS
  Filled 2015-11-22: qty 1

## 2015-11-22 NOTE — ED Provider Notes (Signed)
[facility] Emergency Department Provider Note  ____________________________________________    I have reviewed the triage vital signs and the nursing notes.   HISTORY  Chief Complaint Migraine    HPI Marcelino DusterSusan Wittwer is a 65 y.o. female who presents with complaints of migraine headache. Patient reports frequent migraines. She reports this headache began as a typical migraine but it is not improved after sleeping like her headaches typically do. She reports typically her headaches are characterized by pressure in the back of the head and neck with nausea and vomiting. She almost always wakes up with them in the morning. She denies fevers or chills. No neuro deficits. She took 2 Xanax's without improvement. '     Past Medical History  Diagnosis Date  . Allergy   . Anxiety   . Depression   . Insomnia   . Migraines   . Arthritis     hands, knee    Patient Active Problem List   Diagnosis Date Noted  . Clinical depression 05/07/2015  . H/O gastric ulcer 05/07/2015  . History of migraine headaches 05/07/2015  . Chronic interstitial cystitis 05/07/2015  . Arthritis, degenerative 05/07/2015  . Pure hypercholesterolemia 04/15/2015  . Thrombocytopenia (HCC) 04/15/2015  . GASTRIC ULCER 08/24/2007  . ESOPHAGEAL STRICTURE 04/07/2007  . DIVERTICULOSIS, COLON 02/18/2004    Past Surgical History  Procedure Laterality Date  . Vaginal hysterectomy    . Tonsillectomy    . Breast enhancement surgery    . Esophagogastroduodenoscopy endoscopy    . Colonoscopy  02/18/2004    kaplan -normal    Current Outpatient Rx  Name  Route  Sig  Dispense  Refill  . ALPRAZolam (XANAX) 0.5 MG tablet   Oral   Take 0.5 mg by mouth every 8 (eight) hours as needed.      3   . celecoxib (CELEBREX) 200 MG capsule   Oral   Take 200 mg by mouth 3 (three) times a week.         . cholecalciferol (VITAMIN D) 1000 UNITS tablet   Oral   Take 1,000 Units by mouth daily.         Marland Kitchen. estradiol (ESTRACE) 2 MG tablet      TAKE 1 TABLET BY MOUTH AFTER DOUCHE         . montelukast (SINGULAIR) 10 MG tablet      TAKE 1 TABLET BY MOUTH EVERY DAY         . traZODone (DESYREL) 100 MG tablet   Oral   Take 100 mg by mouth at bedtime.         Marland Kitchen. venlafaxine XR (EFFEXOR-XR) 75 MG 24 hr capsule      TAKE 3 CAPSULES BY MOUTH EVERY DAY           Allergies Bupropion  Family History  Problem Relation Age of Onset  . Colon polyps Sister   . Stomach cancer Paternal Aunt   . Colon cancer Neg Hx   . Esophageal cancer Neg Hx   . Rectal cancer Neg Hx     Social History Social History  Substance Use Topics  . Smoking status: Never Smoker   . Smokeless tobacco: Never Used  . Alcohol Use: No    Review of Systems  Constitutional: Negative for fever. Eyes: Negative for redness ENT: Negative for sore throat Cardiovascular: Negative for chest pain Respiratory: Negative for shortness of breath. Gastrointestinal: Negative for abdominal pain, Positive for nausea  Genitourinary: Negative for dysuria. Musculoskeletal: Negative  for back pain. Skin: Negative for rash. Neurological: Negative for focal weakness, Positive for headache  Psychiatric: no anxiety    ____________________________________________   PHYSICAL EXAM:  VITAL SIGNS: ED Triage Vitals  Enc Vitals Group     BP 11/22/15 1850 169/91 mmHg     Pulse Rate 11/22/15 1850 79     Resp 11/22/15 1850 20     Temp 11/22/15 1850 97.9 F (36.6 C)     Temp Source 11/22/15 1850 Oral     SpO2 11/22/15 1850 98 %     Weight 11/22/15 1850 132 lb (59.875 kg)     Height 11/22/15 1850  (1.626 m)     Head Cir --      Peak Flow --      Pain Score 11/22/15 1851 9     Pain Loc --      Pain Edu? --      Excl. in GC? --      Constitutional: Alert and oriented.  Eyes: Conjunctivae are normal. No erythema or injection, PERRLA, EOMI  ENT   Head: Normocephalic and atraumatic.   Mouth/Throat:  Mucous membranes are moist. Cardiovascular: Normal rate, regular rhythm.  Respiratory: Normal respiratory effort without tachypnea nor retractions.  Gastrointestinal: Soft and non-tender in all quadrants. No distention. There is no CVA tenderness. Genitourinary: deferred Musculoskeletal: Nontender with normal range of motion in all extremities. No lower extremity tenderness nor edema. Neurologic:  Normal speech and language. No gross focal neurologic deficits are appreciated.Cranial nerves II through XII are normal  Skin:  Skin is warm, dry and intact. No rash noted. Psychiatric: Mood and affect are normal. Patient exhibits appropriate insight and judgment.  ____________________________________________    LABS (pertinent positives/negatives)  Labs Reviewed - No data to display  ____________________________________________   EKG  None  ____________________________________________    RADIOLOGY  CT head unremarkable  ____________________________________________   PROCEDURES  Procedure(s) performed: none  Critical Care performed: none  ____________________________________________   INITIAL IMPRESSION / ASSESSMENT AND PLAN / ED COURSE  Pertinent labs & imaging results that were available during my care of the patient were reviewed by me and considered in my medical decision making (see chart for details).  Patient presents with typical migraine headache, but longer duration than is usual for her. We will obtain CT head, and certainly IV give IV fluids, Reglan and Benadryl and if CT is normal we will add Toradol.   Patient did receive Toradol.  She reports near complete resolution of her headache. She is anxious to go home and sleep. She knows to return if any change in her symptoms. ____________________________________________   FINAL CLINICAL IMPRESSION(S) / ED DIAGNOSES  Final diagnoses:  Migraine without status migrainosus, not intractable, unspecified  migraine type          Jene Every, MD 11/22/15 2256

## 2015-11-22 NOTE — ED Notes (Signed)
States she developed a migraine this am   Positive n/v  And also had found a tick on her about 1-1/2 weeks ago

## 2015-11-22 NOTE — Discharge Instructions (Signed)

## 2015-11-22 NOTE — ED Notes (Signed)
Pt placed in sub wait with family  Lights out and warm blankets given

## 2015-11-26 ENCOUNTER — Ambulatory Visit: Payer: No Typology Code available for payment source | Admitting: Gastroenterology

## 2015-12-25 ENCOUNTER — Inpatient Hospital Stay: Payer: No Typology Code available for payment source | Admitting: Oncology

## 2015-12-25 ENCOUNTER — Inpatient Hospital Stay: Payer: No Typology Code available for payment source

## 2016-01-27 ENCOUNTER — Ambulatory Visit: Payer: No Typology Code available for payment source | Admitting: Oncology

## 2016-01-27 ENCOUNTER — Other Ambulatory Visit: Payer: No Typology Code available for payment source

## 2016-02-03 ENCOUNTER — Encounter: Payer: Self-pay | Admitting: Emergency Medicine

## 2016-02-03 ENCOUNTER — Emergency Department
Admission: EM | Admit: 2016-02-03 | Discharge: 2016-02-03 | Disposition: A | Discharge status: Home / Self Care | Payer: No Typology Code available for payment source | Attending: Emergency Medicine | Admitting: Emergency Medicine

## 2016-02-03 DIAGNOSIS — R040 Epistaxis: Secondary | ICD-10-CM

## 2016-02-03 LAB — CBC WITH DIFFERENTIAL/PLATELET
BASOS PCT: 0 %
Basophils Absolute: 0 10*3/uL (ref 0–0.1)
EOS ABS: 0 10*3/uL (ref 0–0.7)
Eosinophils Relative: 1 %
HEMATOCRIT: 39.5 % (ref 35.0–47.0)
Hemoglobin: 13.9 g/dL (ref 12.0–16.0)
Lymphocytes Relative: 17 %
Lymphs Abs: 1.5 10*3/uL (ref 1.0–3.6)
MCH: 31.3 pg (ref 26.0–34.0)
MCHC: 35.3 g/dL (ref 32.0–36.0)
MCV: 88.7 fL (ref 80.0–100.0)
MONO ABS: 0.6 10*3/uL (ref 0.2–0.9)
MONOS PCT: 7 %
NEUTROS ABS: 6.5 10*3/uL (ref 1.4–6.5)
NEUTROS PCT: 75 %
Platelets: 136 10*3/uL — ABNORMAL LOW (ref 150–440)
RBC: 4.46 MIL/uL (ref 3.80–5.20)
RDW: 12.5 % (ref 11.5–14.5)
WBC: 8.7 10*3/uL (ref 3.6–11.0)

## 2016-02-03 MED ORDER — BACITRACIN ZINC 500 UNIT/GM EX OINT
TOPICAL_OINTMENT | CUTANEOUS | Status: AC
  Administered 2016-02-03: 1 via TOPICAL
  Filled 2016-02-03: qty 0.9

## 2016-02-03 MED ORDER — BACITRACIN ZINC 500 UNIT/GM EX OINT
TOPICAL_OINTMENT | Freq: Two times a day (BID) | CUTANEOUS | Status: DC
  Administered 2016-02-03: 1 via TOPICAL

## 2016-02-03 MED ORDER — CLONIDINE HCL 0.2 MG PO TABS: 0.2000 mg | ORAL_TABLET | Freq: Two times a day (BID) | ORAL | 0 refills | Status: DC

## 2016-02-03 MED ORDER — CLONIDINE HCL 0.1 MG PO TABS
ORAL_TABLET | ORAL | Status: AC
  Administered 2016-02-03: 0.2 mg via ORAL
  Filled 2016-02-03: qty 2

## 2016-02-03 MED ORDER — OXYMETAZOLINE HCL 0.05 % NA SOLN
NASAL | Status: AC
  Administered 2016-02-03: 1 via NASAL
  Filled 2016-02-03: qty 15

## 2016-02-03 MED ORDER — CLONIDINE HCL 0.2 MG PO TABS
0.2000 mg | ORAL_TABLET | Freq: Once | ORAL | Status: AC
  Administered 2016-02-03: 0.2 mg via ORAL
  Filled 2016-02-03: qty 1

## 2016-02-03 MED ORDER — OXYMETAZOLINE HCL 0.05 % NA SOLN
1.0000 | Freq: Once | NASAL | Status: AC
  Administered 2016-02-03: 1 via NASAL

## 2016-02-03 NOTE — Discharge Instructions (Signed)
If bleeding reoccurs spray the Afrin nasal spray. If it still is recurring then pinch her nose and hold for approximately 15 minutes. If the bleeding continues she may have to return here to the emergency department. If the packing falls out please just wait before returning and see if rebleeding occurs. These call the ENT and contact them for packing removal. Usually packing is removed in 48-72 hours

## 2016-02-03 NOTE — ED Provider Notes (Addendum)
Time Seen: Approximately 1511  I have reviewed the triage notes  Chief Complaint: Epistaxis   History of Present Illness: Laura DusterSusan Sanders is a 65 y.o. female *who presents with acute epistaxis, right-sided starting at approximately 11 AM. Patient's bleeding is now stopped upon my evaluation after a nose clamp was applied in the triage area. He states he does have a history of nosebleeds but usually they stop on her own and she also has a history of thrombocytopenia.   Past Medical History:  Diagnosis Date  . Allergy   . Anxiety   . Arthritis    hands, knee  . Depression   . Insomnia   . Migraines     Patient Active Problem List   Diagnosis Date Noted  . Clinical depression 05/07/2015  . H/O gastric ulcer 05/07/2015  . History of migraine headaches 05/07/2015  . Chronic interstitial cystitis 05/07/2015  . Arthritis, degenerative 05/07/2015  . Pure hypercholesterolemia 04/15/2015  . Thrombocytopenia (HCC) 04/15/2015  . GASTRIC ULCER 08/24/2007  . ESOPHAGEAL STRICTURE 04/07/2007  . DIVERTICULOSIS, COLON 02/18/2004    Past Surgical History:  Procedure Laterality Date  . BREAST ENHANCEMENT SURGERY    . COLONOSCOPY  02/18/2004   kaplan -normal  . ESOPHAGOGASTRODUODENOSCOPY ENDOSCOPY    . TONSILLECTOMY    . VAGINAL HYSTERECTOMY      Past Surgical History:  Procedure Laterality Date  . BREAST ENHANCEMENT SURGERY    . COLONOSCOPY  02/18/2004   kaplan -normal  . ESOPHAGOGASTRODUODENOSCOPY ENDOSCOPY    . TONSILLECTOMY    . VAGINAL HYSTERECTOMY      Current Outpatient Rx  . Order #: [id number] Class: Historical Med  . Order #: [id number] Class: Print  . Order #: [id number] Class: Historical Med  . Order #: [id number] Class: Historical Med  . Order #: [id number] Class: Historical Med  . Order #: [id number] Class: Historical Med  . Order #: [id number] Class: Historical Med  . Order #: [id number] Class: Historical Med    Allergies:  Bupropion  Family History: Family History   Problem Relation Age of Onset  . Colon polyps Sister   . Stomach cancer Paternal Aunt   . Colon cancer Neg Hx   . Esophageal cancer Neg Hx   . Rectal cancer Neg Hx     Social History: Social History  Substance Use Topics  . Smoking status: Never Smoker  . Smokeless tobacco: Never Used  . Alcohol use No     Review of Systems:   10 point review of systems was performed and was otherwise negative:  Constitutional: No fever Eyes: No visual disturbances ENT: No sore throat, ear pain. She denies any trauma to her nose Cardiac: No chest pain Respiratory: No shortness of breath, wheezing, or stridor Abdomen: No abdominal pain, mild nausea, no vomiting, No diarrhea Endocrine: No weight loss, No night sweats Extremities: No peripheral edema, cyanosis Skin: No rashes, easy bruising Neurologic: No focal weakness, trouble with speech or swollowing Urologic: No dysuria, Hematuria, or urinary frequency   Physical Exam:  ED Triage Vitals [02/03/16 1405]  Enc Vitals Group     BP (!) 190/103     Pulse Rate 84     Resp 16     Temp 98.5 F (36.9 C)     Temp Source Oral     SpO2 98 %     Weight 132 lb (59.9 kg)     Height 5\' 4"  (1.626 m)     Head Circumference      Peak Flow  Pain Score 2     Pain Loc      Pain Edu?      Excl. in GC?     General: Awake , Alert , and Oriented times 3; GCS 15 Head: Normal cephalic , atraumatic Eyes: Pupils equal , round, reactive to light Nose/Throat: Patient appears to have had a right-sided nosebleed with some area of dried blood and inflammation over the medial plexus. No active bleeding at this time and no visible nasal polyps or posterior bleeding. Left nasal canal appears clear.  Neck: Supple, Full range of motion, No anterior adenopathy or palpable thyroid masses Lungs: Clear to ascultation without wheezes , rhonchi, or rales Heart: Regular rate, regular rhythm without murmurs , gallops , or rubs Abdomen: Soft, non tender without  rebound, guarding , or rigidity; bowel sounds positive and symmetric in all 4 quadrants. No organomegaly .        Extremities: 2 plus symmetric pulses. No edema, clubbing or cyanosis Neurologic: normal ambulation, Motor symmetric without deficits, sensory intact Skin: warm, dry, no rashes   Labs:   All laboratory work was reviewed including any pertinent negatives or positives listed below:  Labs Reviewed  CBC WITH DIFFERENTIAL/PLATELET - Abnormal; Notable for the following:       Result Value   Platelets 136 (*)    All other components within normal limits   Procedures:  Nasal packing Patient had insertion of a packing material inflated with Afrin nasal spray. Packing was coated with bacitracin ointment. Patient tolerated the procedure well and only the right side was packed.    ED Course:  Patient's stay here was uneventful and she remained stable and her platelet count appears to be good for the patient based on her past numbers. The patient's otherwise stable for discharge and doesn't appear to be anemic or otherwise symptomatic. She was referred to ENT unassigned for packing removal. Patient was noted at the time of discharge she still had persistent hypertension she was given clonidine 0.2 mg which only shows slight improvement in her blood pressure. Patient states she's been under a lot of stress and very anxious and her blood pressure can run high in a hospital setting. I will try discharging her on clonidine and she agrees to follow up with her primary physician concerning her blood pressure issue  Clinical Course     Assessment:  Anterior right-sided epistaxis History of thrombocytopenia  Final Clinical Impression:   Final diagnoses:  Anterior epistaxis     Plan:  Outpatient Patient was advised to return immediately if condition worsens. Patient was advised to follow up with their primary care physician or other specialized physicians involved in their outpatient  care. The patient and/or family member/power of attorney had laboratory results reviewed at the bedside. All questions and concerns were addressed and appropriate discharge instructions were distributed by the nursing staff.            Jennye MoccasinBrian S Shadoe Cryan, MD 02/03/16 1641    Jennye MoccasinBrian S Alessio Bogan, MD 02/03/16 [phone]

## 2016-02-03 NOTE — ED Triage Notes (Signed)
Pt reports nosebleed since 11am. Pt denies use of blood thinners, reports hx of low platelets. Nose clamp applied upon triage.

## 2016-02-03 NOTE — ED Notes (Signed)
MD at bedside. 

## 2016-02-24 IMAGING — MG MM SCREENING W/IMPLANTS
8 series · 8 of 8 positions shown · non-contrast
Comparison: Previous exam(s)

CLINICAL DATA: Screening.

EXAM:
DIGITAL SCREENING BILATERAL MAMMOGRAM WITH IMPLANTS AND CAD
The patient has implants. Standard and implant displaced views were
performed.

[R CC]
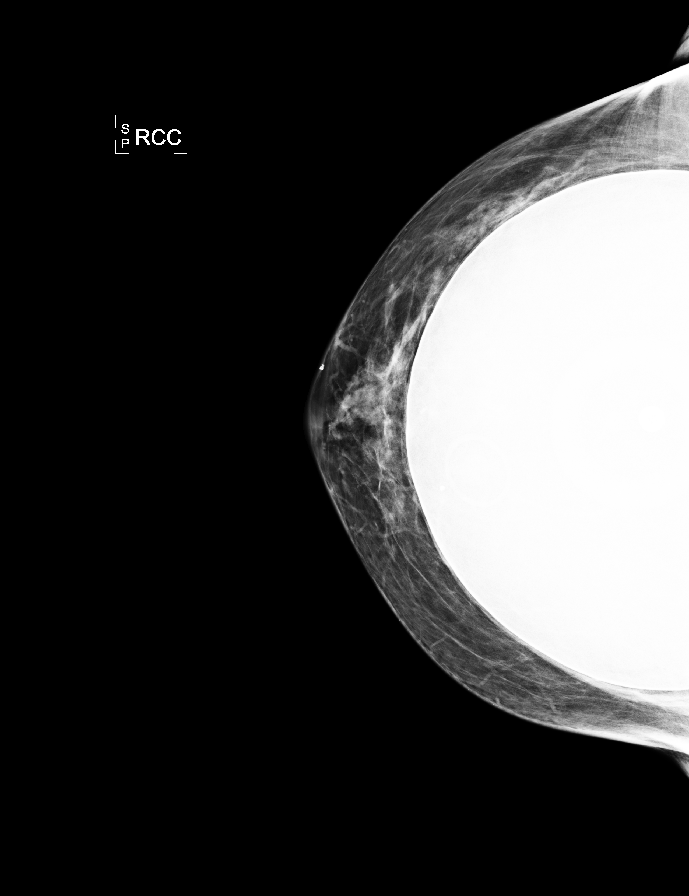

[L CC]
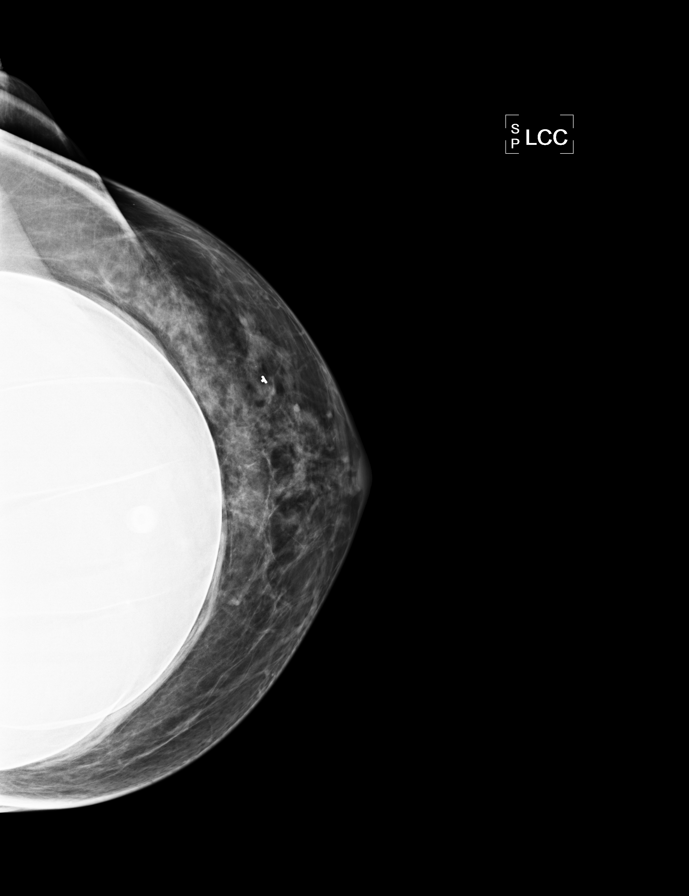

[L MLO]
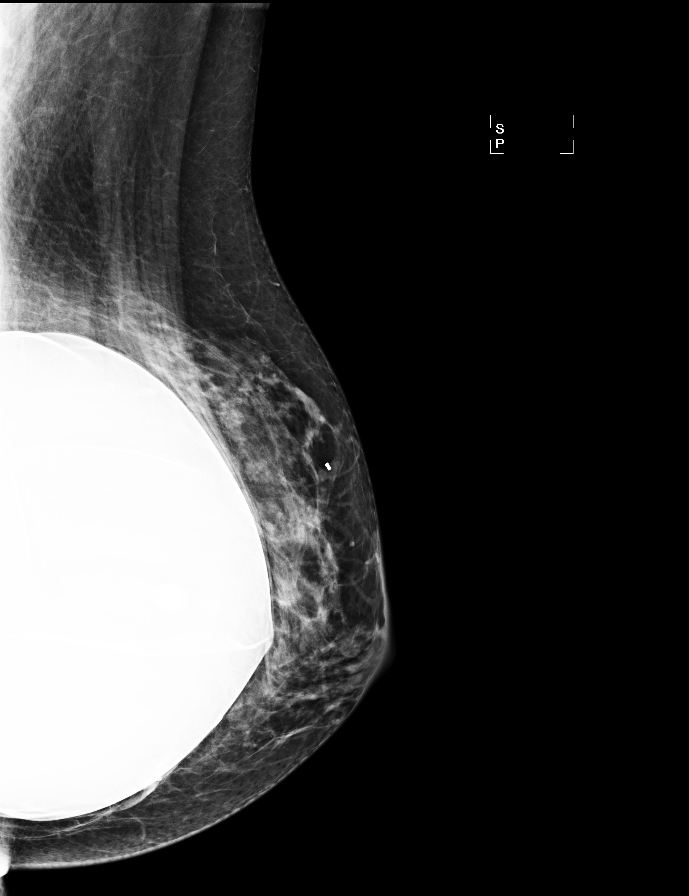

[R MLO]
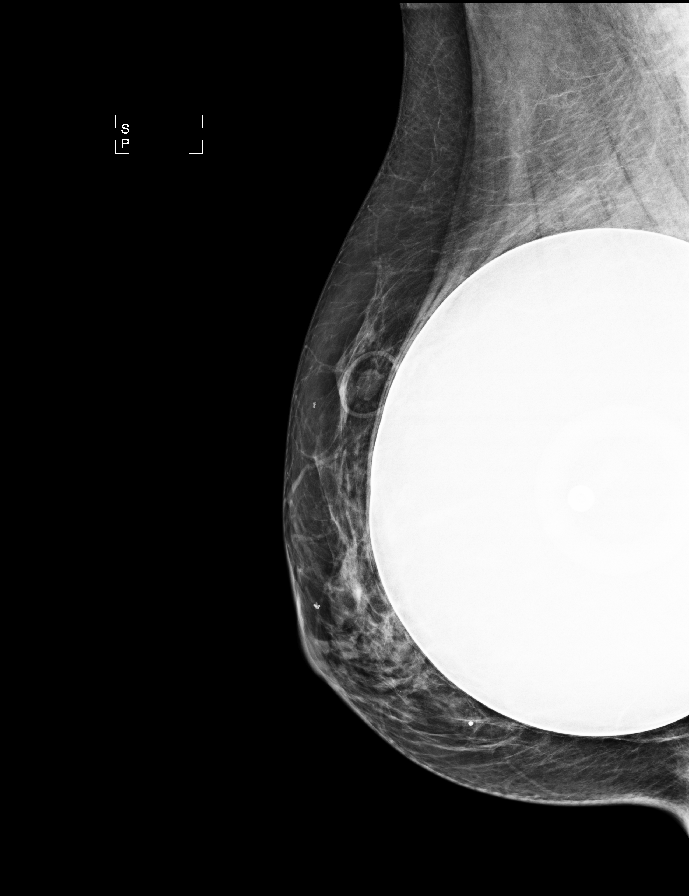

[R CCID]
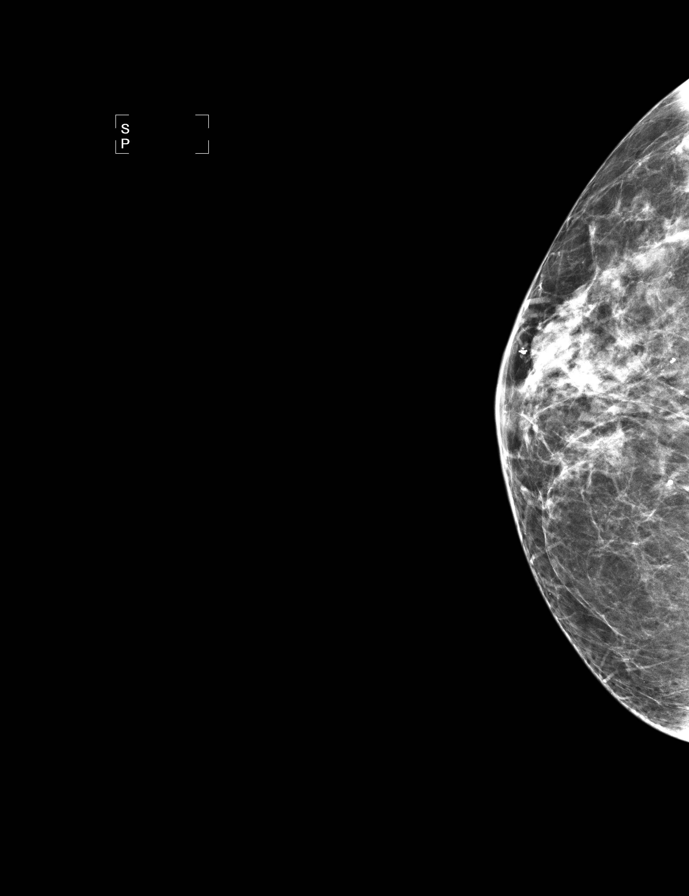

[L CCID]
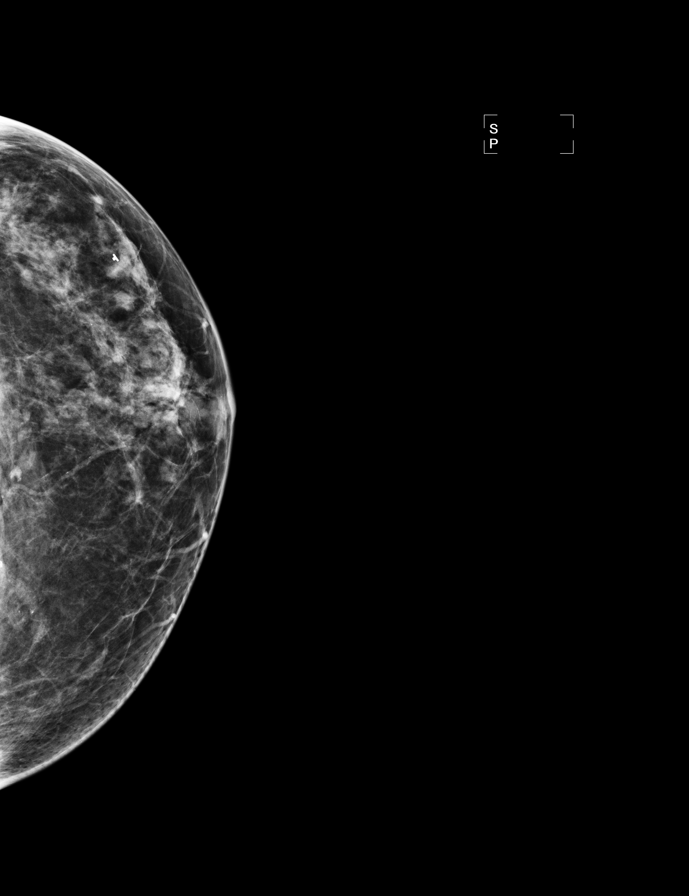

[L MLOID]
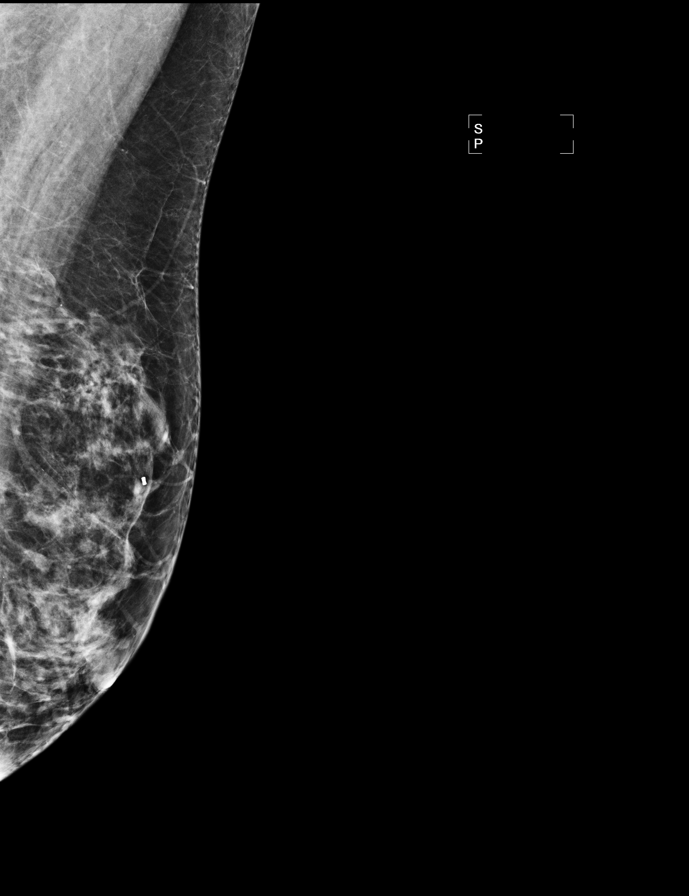

[R MLOID]
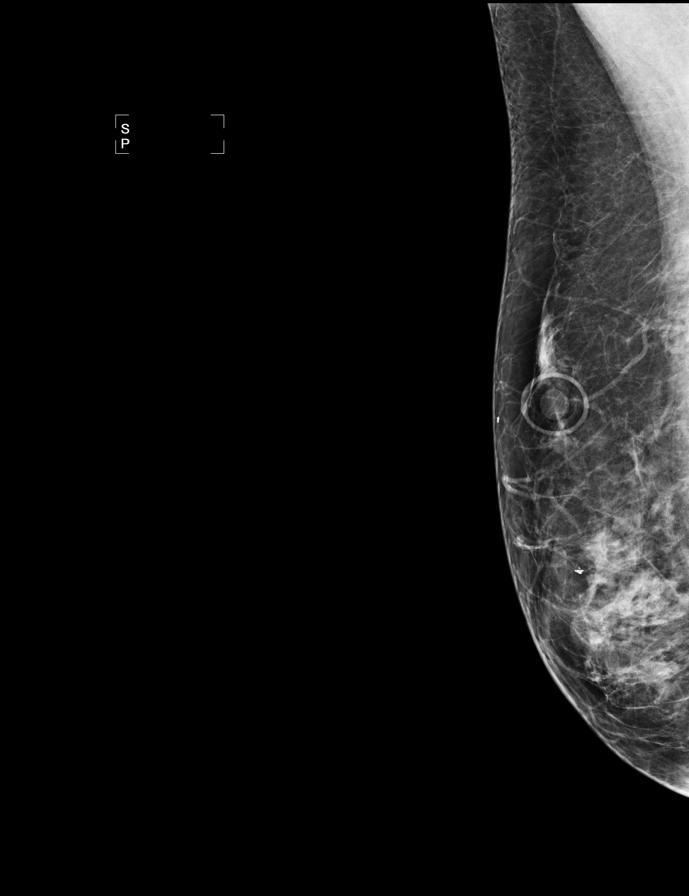

[8 of 8 positions shown; findings below may reference images not displayed]

ACR Breast Density Category b: There are scattered areas of
fibroglandular density.
FINDINGS: There are no findings suspicious for malignancy. Images were
processed with CAD.
IMPRESSION: No mammographic evidence of malignancy. A result letter of this
screening mammogram will be mailed directly to the patient.

RECOMMENDATION:
Screening mammogram in one year. (Code:NR-J-K6Y)

BI-RADS CATEGORY  1:  Negative.

## 2016-03-02 DIAGNOSIS — I10 Essential (primary) hypertension: Secondary | ICD-10-CM | POA: Insufficient documentation

## 2016-12-15 IMAGING — CT CT HEAD W/O CM
3 series · 16 of 47 positions shown, 19 images · non-contrast
Comparison: 08/20/2011

CLINICAL DATA: Migraine this morning

EXAM:
CT HEAD WITHOUT CONTRAST
TECHNIQUE: Contiguous axial images were obtained from the base of the skull
through the vertex without intravenous contrast.

[Series 2: head wo · axial · 0.40mm/px · z∈[-196,-71]mm · 10 of 31 slices shown, 13 images]
[im 3/31  brain]
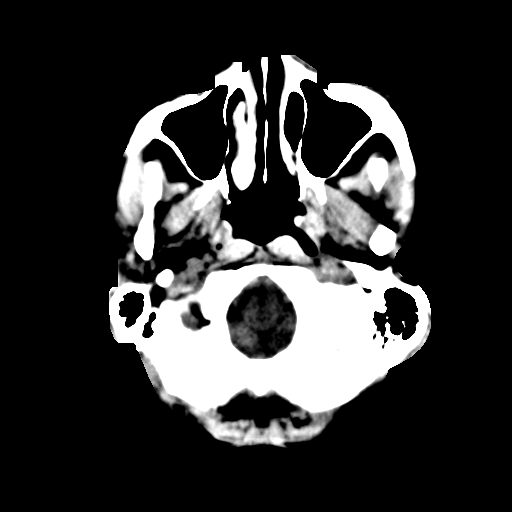
[im 3/31  bone]
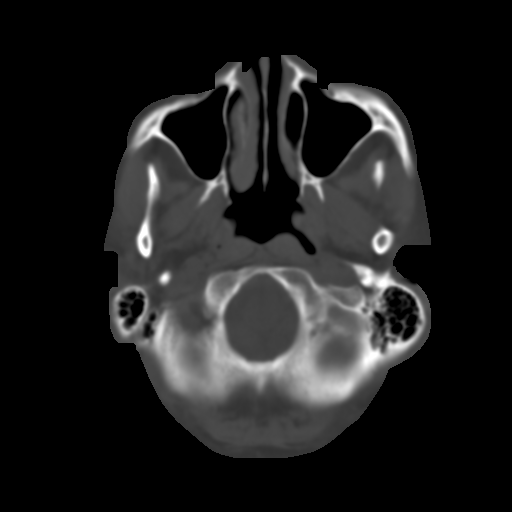
[im 6/31  brain]
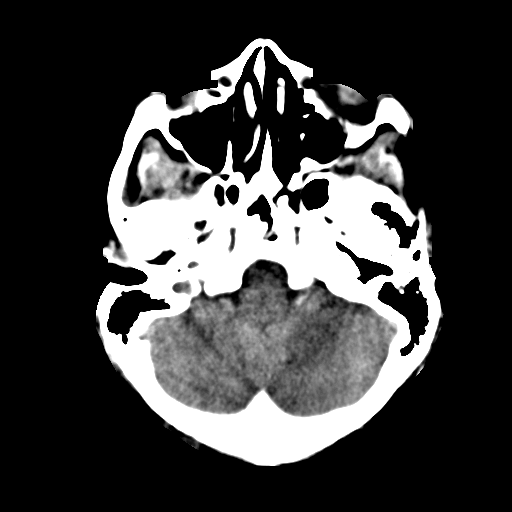
[im 9/31  brain]
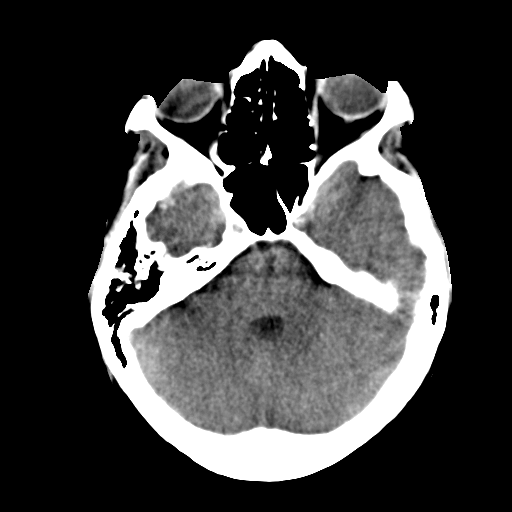
[im 11/31  brain]
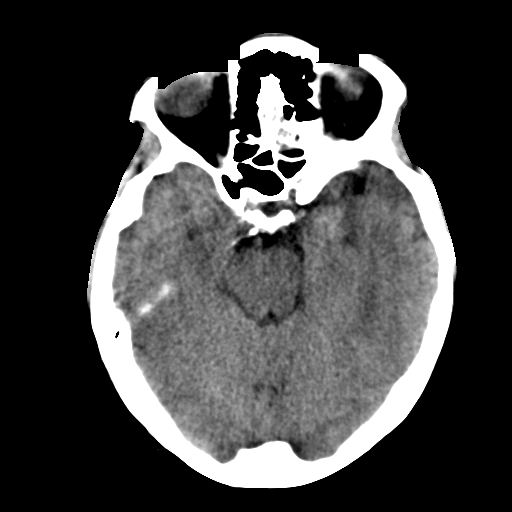
[im 14/31  brain]
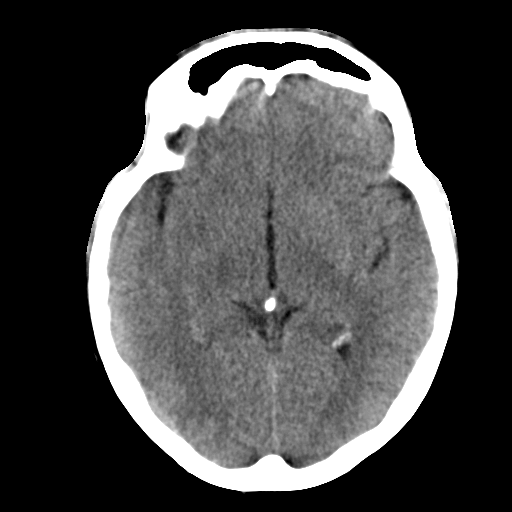
[im 14/31  bone]
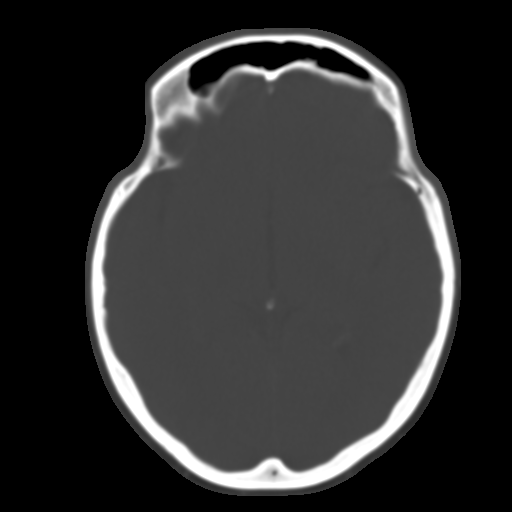
[im 17/31  brain]
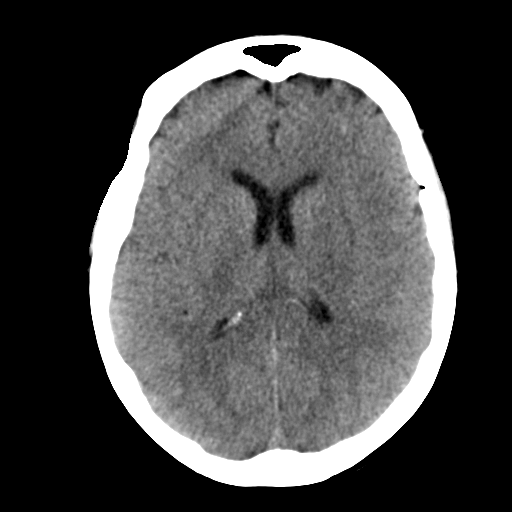
[im 20/31  brain]
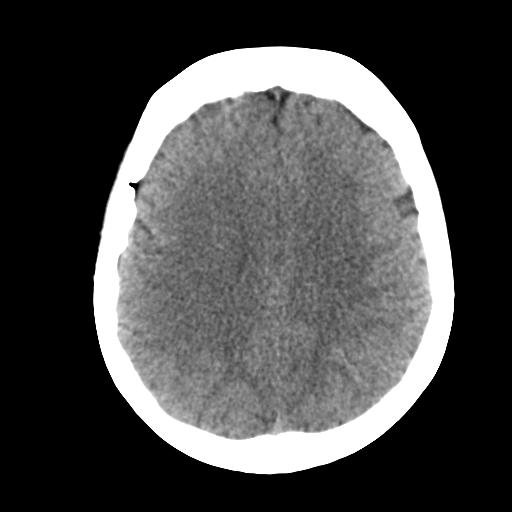
[im 23/31  brain]
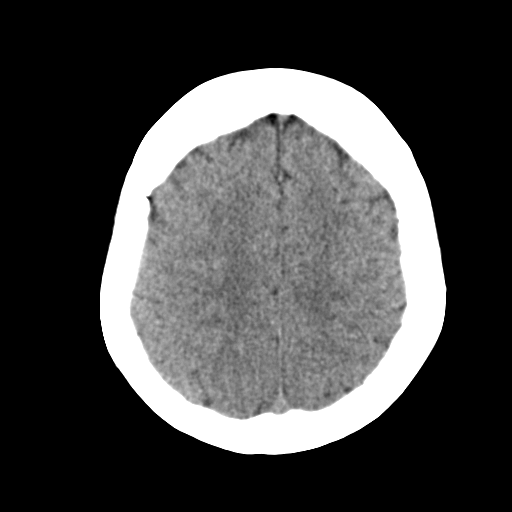
[im 25/31  brain]
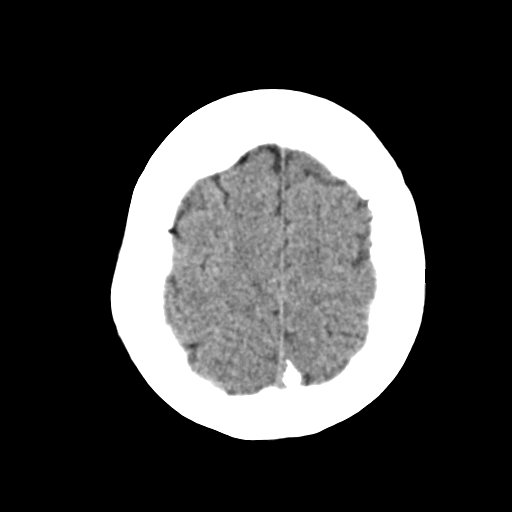
[im 25/31  bone]
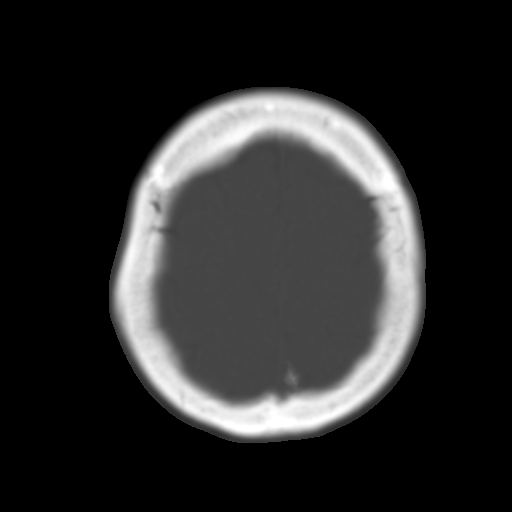
[im 28/31  brain]
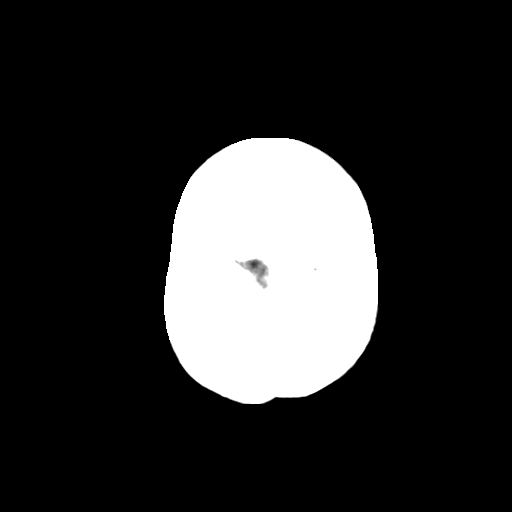

[Series 4: coronal soft · coronal · 0.32mm/px · 3 of 61 slices shown]
[im 21/61  brain]
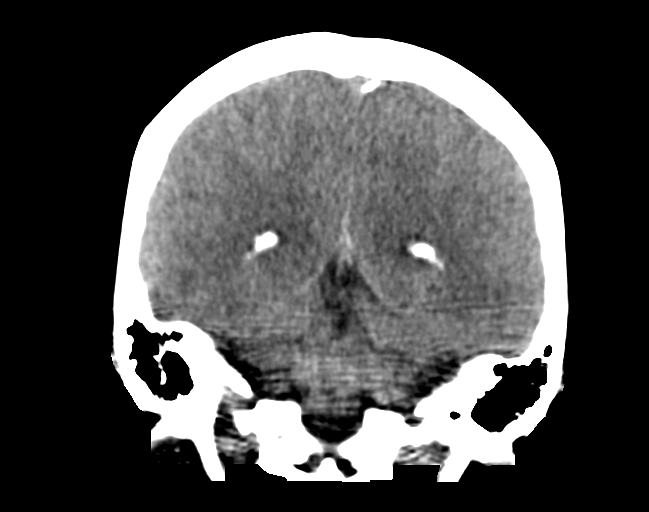
[im 27/61  brain]
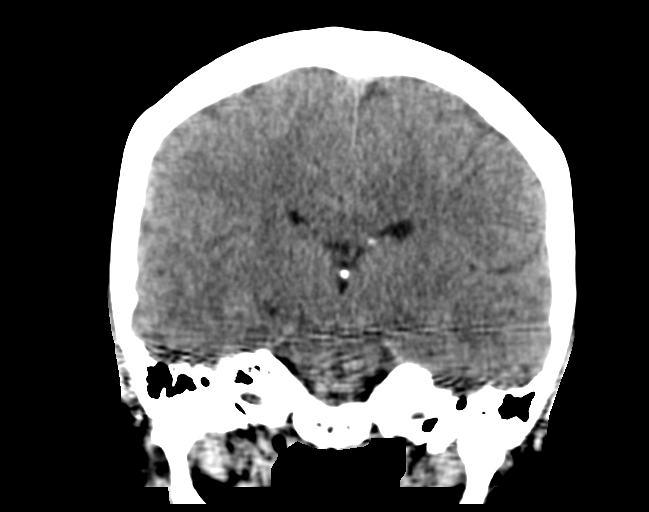
[im 34/61  brain]
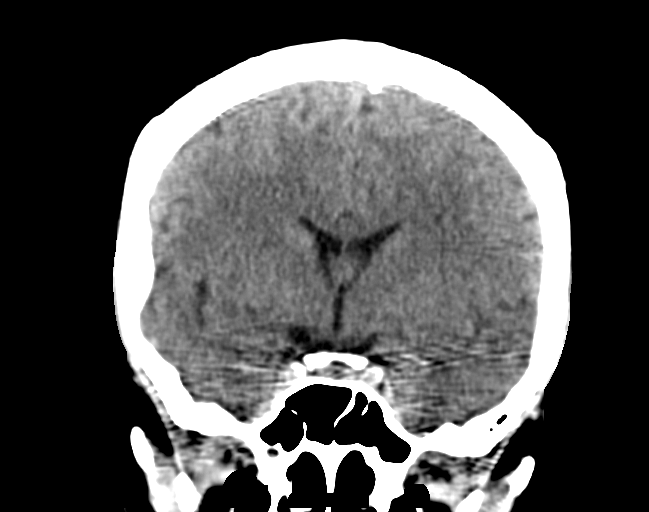

[Series 5: sagittal soft · sagittal · 0.42mm/px · 3 of 54 slices shown]
[im 18/54  brain]
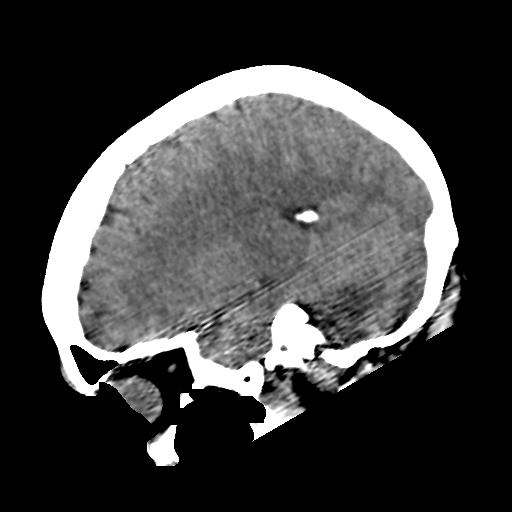
[im 27/54  brain]
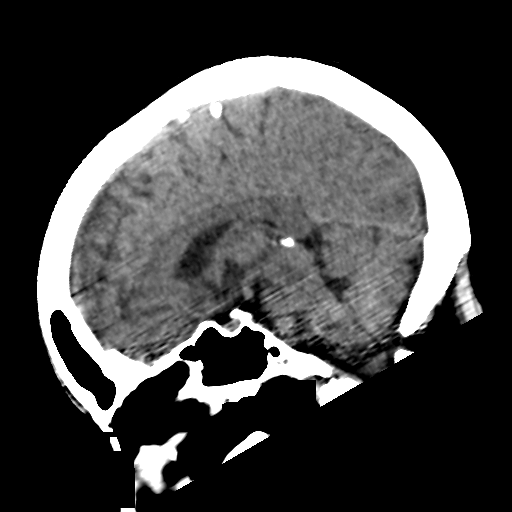
[im 36/54  brain]
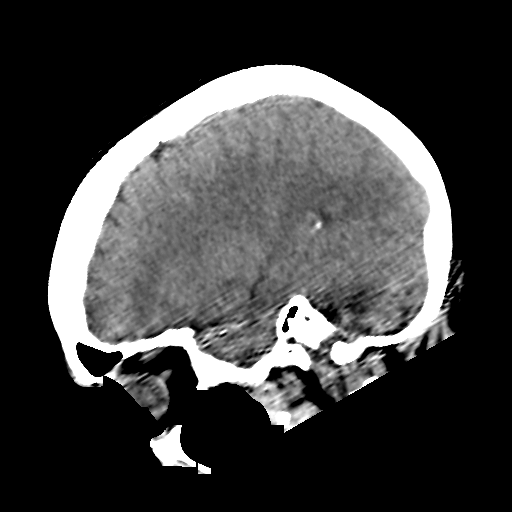

[16 of 47 positions shown; findings below may reference images not displayed]

FINDINGS: No mass lesion. No midline shift. No acute hemorrhage or hematoma.
No extra-axial fluid collections. No evidence of acute infarction.
Calvarium intact.
IMPRESSION: Normal head CT.

## 2017-03-24 DIAGNOSIS — M79641 Pain in right hand: Secondary | ICD-10-CM | POA: Insufficient documentation

## 2017-03-24 DIAGNOSIS — M79642 Pain in left hand: Secondary | ICD-10-CM | POA: Insufficient documentation

## 2017-07-29 ENCOUNTER — Other Ambulatory Visit: Payer: Self-pay | Admitting: Internal Medicine

## 2017-07-29 DIAGNOSIS — Z139 Encounter for screening, unspecified: Secondary | ICD-10-CM

## 2017-08-18 ENCOUNTER — Ambulatory Visit
Admission: RE | Admit: 2017-08-18 | Discharge: 2017-08-18 | Disposition: A | Discharge status: Home / Self Care | Payer: Medicare Other | Source: Ambulatory Visit | Attending: Internal Medicine | Admitting: Internal Medicine

## 2017-08-18 DIAGNOSIS — Z139 Encounter for screening, unspecified: Secondary | ICD-10-CM

## 2017-09-01 DIAGNOSIS — M23329 Other meniscus derangements, posterior horn of medial meniscus, unspecified knee: Secondary | ICD-10-CM | POA: Insufficient documentation

## 2017-10-06 DIAGNOSIS — Z5189 Encounter for other specified aftercare: Secondary | ICD-10-CM | POA: Insufficient documentation

## 2018-02-06 ENCOUNTER — Ambulatory Visit: Payer: Medicare Other | Admitting: Physician Assistant

## 2018-03-14 ENCOUNTER — Other Ambulatory Visit: Payer: Self-pay | Admitting: Orthopedic Surgery

## 2018-03-14 NOTE — H&P (Signed)
TOTAL KNEE ADMISSION H&P  Patient is being admitted for right total knee arthroplasty.  Subjective:  Chief Complaint:right knee pain.  HPI: Laura Sanders, 67 y.o. female, has a history of pain and functional disability in the right knee due to arthritis and has failed non-surgical conservative treatments for greater than 12 weeks to includecorticosteriod injections and activity modification.  Onset of symptoms was abrupt, starting 1.5 years ago with rapidly worsening course since that time. The patient noted no past surgery on the right knee(s).  Patient currently rates pain in the right knee(s) at 10 out of 10 with activity. Patient has worsening of pain with activity and weight bearing, crepitus and instability.  Patient has evidence of bone-on-bone osteoarthritis of the medial compartment with significant patellofemoral narrowing by imaging studies. There is no active infection.  Patient Active Problem List   Diagnosis Date Noted  . Clinical depression 05/07/2015  . H/O gastric ulcer 05/07/2015  . History of migraine headaches 05/07/2015  . Chronic interstitial cystitis 05/07/2015  . Arthritis, degenerative 05/07/2015  . Pure hypercholesterolemia 04/15/2015  . Thrombocytopenia (HCC) 04/15/2015  . GASTRIC ULCER 08/24/2007  . ESOPHAGEAL STRICTURE 04/07/2007  . DIVERTICULOSIS, COLON 02/18/2004   Past Medical History:  Diagnosis Date  . Allergy   . Anxiety   . Arthritis    hands, knee  . Depression   . Insomnia   . Migraines     Past Surgical History:  Procedure Laterality Date  . AUGMENTATION MAMMAPLASTY Bilateral   . BREAST ENHANCEMENT SURGERY    . COLONOSCOPY  02/18/2004   kaplan -normal  . ESOPHAGOGASTRODUODENOSCOPY ENDOSCOPY    . TONSILLECTOMY    . VAGINAL HYSTERECTOMY      No current facility-administered medications for this encounter.    Current Outpatient Medications  Medication Sig Dispense Refill Last Dose  . ALPRAZolam (XANAX) 0.5 MG tablet Take 0.5 mg by mouth  every 8 (eight) hours as needed.  3 Taking  . celecoxib (CELEBREX) 200 MG capsule Take 200 mg by mouth 3 (three) times a week.   Taking  . cholecalciferol (VITAMIN D) 1000 UNITS tablet Take 1,000 Units by mouth daily.   Taking  . cloNIDine (CATAPRES) 0.2 MG tablet Take 1 tablet (0.2 mg total) by mouth 2 (two) times daily. 60 tablet 0   . estradiol (ESTRACE) 2 MG tablet TAKE 1 TABLET BY MOUTH AFTER DOUCHE   Taking  . montelukast (SINGULAIR) 10 MG tablet TAKE 1 TABLET BY MOUTH EVERY DAY   Taking  . traZODone (DESYREL) 100 MG tablet Take 100 mg by mouth at bedtime.   Taking  . venlafaxine XR (EFFEXOR-XR) 75 MG 24 hr capsule TAKE 3 CAPSULES BY MOUTH EVERY DAY   Taking   Allergies  Allergen Reactions  . Bupropion Other (See Comments)    Makes patient nervous    Social History   Tobacco Use  . Smoking status: Never Smoker  . Smokeless tobacco: Never Used  Substance Use Topics  . Alcohol use: No    Alcohol/week: 0.0 standard drinks    Family History  Problem Relation Age of Onset  . Colon polyps Sister   . Stomach cancer Paternal Aunt   . Colon cancer Neg Hx   . Esophageal cancer Neg Hx   . Rectal cancer Neg Hx      Review of Systems  Constitutional: Negative for chills and fever.  HENT: Negative for congestion, sore throat and tinnitus.   Eyes: Negative for double vision, photophobia and pain.  Respiratory: Negative  for cough, shortness of breath and wheezing.   Cardiovascular: Negative for chest pain, palpitations and orthopnea.  Gastrointestinal: Negative for heartburn, nausea and vomiting.  Genitourinary: Negative for dysuria, frequency and urgency.  Musculoskeletal: Positive for joint pain.  Neurological: Negative for dizziness, weakness and headaches.    Objective:  Physical Exam  Well nourished and well developed. General: Alert and oriented x3, cooperative and pleasant, no acute distress. Head: normocephalic, atraumatic, neck supple. Eyes: EOMI. Respiratory: breath  sounds clear in all fields, no wheezing, rales, or rhonchi. Cardiovascular: Regular rate and rhythm, no murmurs, gallops or rubs.  Abdomen: non-tender to palpation and soft, normoactive bowel sounds. Musculoskeletal: Antalgic gait without using assisted devices.  Right Hip Exam: ROM: is normal without discomfort. There is no tenderness over the greater trochanter bursa. There is no pain on provocative testing of the hip. Right Knee Exam: No effusion. No Swelling. Range of motion is 0-135 degrees. No crepitus on range of motion of the knee. No medial joint line tendernessMinimal lateral joint line tenderness. Stable knee. Calves soft and nontender. Motor function intact in LE. Strength 5/5 LE bilaterally. Neuro: Distal pulses 2+. Sensation to light touch intact in LE.  Vital signs in last 24 hours: Blood pressure: 110/74 mmHg Pulse: 68 bpm  Labs:   Estimated body mass index is 22.66 kg/m as calculated from the following:   Height as of 02/03/16: 5\' 4"  (1.626 m).   Weight as of 02/03/16: 59.9 kg.   Imaging Review Plain radiographs demonstrate severe degenerative joint disease of the right knee(s). The overall alignment isneutral. The bone quality appears to be adequate for age and reported activity level.   Preoperative templating of the joint replacement has been completed, documented, and submitted to the Operating Room personnel in order to optimize intra-operative equipment management.   Anticipated LOS equal to or greater than 2 midnights due to - Age 52 and older with one or more of the following:  - Obesity  - Expected need for hospital services (PT, OT, Nursing) required for safe  discharge  - Anticipated need for postoperative skilled nursing care or inpatient rehab  - Active co-morbidities: None OR   - Unanticipated findings during/Post Surgery: None  - Patient is a high risk of re-admission due to: None     Assessment/Plan:  End stage arthritis, right knee   The  patient history, physical examination, clinical judgment of the provider and imaging studies are consistent with end stage degenerative joint disease of the right knee(s) and total knee arthroplasty is deemed medically necessary. The treatment options including medical management, injection therapy arthroscopy and arthroplasty were discussed at length. The risks and benefits of total knee arthroplasty were presented and reviewed. The risks due to aseptic loosening, infection, stiffness, patella tracking problems, thromboembolic complications and other imponderables were discussed. The patient acknowledged the explanation, agreed to proceed with the plan and consent was signed. Patient is being admitted for inpatient treatment for surgery, pain control, PT, OT, prophylactic antibiotics, VTE prophylaxis, progressive ambulation and ADL's and discharge planning. The patient is planning to be discharged home with outpatient physical therapy.   Therapy Plans: outpatient therapy at [facility] in [place] Disposition: Home with husband Planned DVT Prophylaxis: Aspirin 325 mg BID DME needed: [facility] PCP: Dr. Judithann Sheen TXA: IV Allergies: NKDA Anesthesia Concerns: Nausea/vomiting (patient notes that zofran has been ineffective in the past) Other: Reports platelet counts have recently been fluctuating   - Patient was instructed on what medications to stop prior to surgery. -  Follow-up visit in 2 weeks with Dr. Wynelle Link - Begin physical therapy following surgery - Pre-operative lab work as pre-surgical testing - Prescriptions will be provided in hospital at time of discharge  Theresa Duty, PA-C Orthopedic Surgery [facility] [place]

## 2018-03-14 NOTE — Care Plan (Signed)
R TKA scheduled on 04-03-18 DCP:  Home with spouse.  1 story home with 3 ste. DME:  Has a SW and 3-in-1 PT:  [facility].  PT eval scheduled on 04-07-18

## 2018-03-28 ENCOUNTER — Other Ambulatory Visit: Payer: Self-pay

## 2018-03-28 ENCOUNTER — Encounter (HOSPITAL_COMMUNITY)
Admission: RE | Admit: 2018-03-28 | Discharge: 2018-03-28 | Disposition: A | Discharge status: Home / Self Care | Payer: Medicare Other | Source: Ambulatory Visit | Attending: Orthopedic Surgery | Admitting: Orthopedic Surgery

## 2018-03-28 ENCOUNTER — Encounter (HOSPITAL_COMMUNITY): Payer: Self-pay

## 2018-03-28 DIAGNOSIS — Z01812 Encounter for preprocedural laboratory examination: Secondary | ICD-10-CM | POA: Insufficient documentation

## 2018-03-28 HISTORY — DX: Other complications of anesthesia, initial encounter: T88.59XA

## 2018-03-28 HISTORY — DX: Essential (primary) hypertension: I10

## 2018-03-28 HISTORY — DX: Adverse effect of unspecified anesthetic, initial encounter: T41.45XA

## 2018-03-28 HISTORY — DX: Thrombocytopenia, unspecified: D69.6

## 2018-03-28 LAB — CBC
HEMATOCRIT: 38.5 % (ref 36.0–46.0)
HEMOGLOBIN: 12.6 g/dL (ref 12.0–15.0)
MCH: 30.2 pg (ref 26.0–34.0)
MCHC: 32.7 g/dL (ref 30.0–36.0)
MCV: 92.3 fL (ref 80.0–100.0)
NRBC: 0 % (ref 0.0–0.2)
Platelets: 78 10*3/uL — ABNORMAL LOW (ref 150–400)
RBC: 4.17 MIL/uL (ref 3.87–5.11)
RDW: 11.5 % (ref 11.5–15.5)
WBC: 7.5 10*3/uL (ref 4.0–10.5)

## 2018-03-28 LAB — COMPREHENSIVE METABOLIC PANEL
ALBUMIN: 3.8 g/dL (ref 3.5–5.0)
ALK PHOS: 44 U/L (ref 38–126)
ALT: 13 U/L (ref 0–44)
AST: 19 U/L (ref 15–41)
Anion gap: 7 (ref 5–15)
BILIRUBIN TOTAL: 0.6 mg/dL (ref 0.3–1.2)
BUN: 13 mg/dL (ref 8–23)
CALCIUM: 9.7 mg/dL (ref 8.9–10.3)
CO2: 33 mmol/L — ABNORMAL HIGH (ref 22–32)
CREATININE: 0.81 mg/dL (ref 0.44–1.00)
Chloride: 99 mmol/L (ref 98–111)
GFR calc Af Amer: >60 mL/min (ref >60)
GLUCOSE: 76 mg/dL (ref 70–99)
Potassium: 4.2 mmol/L (ref 3.5–5.1)
Sodium: 139 mmol/L (ref 135–145)
TOTAL PROTEIN: 7.4 g/dL (ref 6.5–8.1)

## 2018-03-28 LAB — APTT: aPTT: 31 seconds (ref 24–36)

## 2018-03-28 LAB — SURGICAL PCR SCREEN
MRSA, PCR: NEGATIVE
Staphylococcus aureus: NEGATIVE

## 2018-03-28 LAB — PROTIME-INR
INR: 0.92
PROTHROMBIN TIME: 12.3 s (ref 11.4–15.2)

## 2018-03-28 LAB — ABO/RH: ABO/RH(D): B POS

## 2018-03-28 NOTE — Progress Notes (Signed)
Medical clearance Dr Charyl Dancer 03-07-18 on chart   EKG on chart

## 2018-03-28 NOTE — Patient Instructions (Signed)
Laura Sanders  03/28/2018   Your procedure is scheduled on: 04-03-18   Report to [facility] Main  Entrance    Report to admitting at 12:20PM    Call this number if you have problems the morning of surgery [phone]     Remember: Do not eat food After Midnight. YOU MAY HAVE CLEAR LIQUIDS FROM MIDNIGHT UNTIL 8:45AM. NOTHING BY MOUTH AFTER 8:45AM! BRUSH YOUR TEETH MORNING OF SURGERY AND RINSE YOUR MOUTH OUT, NO CHEWING GUM CANDY OR MINTS.      CLEAR LIQUID DIET   Foods Allowed                                                                     Foods Excluded  Coffee and tea, regular and decaf                             liquids that you cannot  Plain Jell-O in any flavor                                             see through such as: Fruit ices (not with fruit pulp)                                     milk, soups, orange juice  Iced Popsicles                                    All solid food Carbonated beverages, regular and diet                                    Cranberry, grape and apple juices Sports drinks like Gatorade Lightly seasoned clear broth or consume(fat free) Sugar, honey syrup  Sample Menu Breakfast                                Lunch                                     Supper Cranberry juice                    Beef broth                            Chicken broth Jell-O                                     Grape juice  Apple juice Coffee or tea                        Jell-O                                      Popsicle                                                Coffee or tea                        Coffee or tea  _____________________________________________________________________       Take these medicines the morning of surgery with A SIP OF WATER: venlafaxine, xanax if needed                                You may not have any metal on your body including hair pins and              piercings  Do  not wear jewelry, make-up, lotions, powders or perfumes, deodorant             Do not wear nail polish.  Do not shave  48 hours prior to surgery.         Do not bring valuables to the hospital. [facility] IS NOT             RESPONSIBLE   FOR VALUABLES.  Contacts, dentures or bridgework may not be worn into surgery.  Leave suitcase in the car. After surgery it may be brought to your room.                  Please read over the following fact sheets you were given: _____________________________________________________________________             [facility] - Preparing for Surgery Before surgery, you can play an important role.  Because skin is not sterile, your skin needs to be as free of germs as possible.  You can reduce the number of germs on your skin by washing with CHG (chlorahexidine gluconate) soap before surgery.  CHG is an antiseptic cleaner which kills germs and bonds with the skin to continue killing germs even after washing. Please DO NOT use if you have an allergy to CHG or antibacterial soaps.  If your skin becomes reddened/irritated stop using the CHG and inform your nurse when you arrive at Short Stay. Do not shave (including legs and underarms) for at least 48 hours prior to the first CHG shower.  You may shave your face/neck. Please follow these instructions carefully:  1.  Shower with CHG Soap the night before surgery and the  morning of Surgery.  2.  If you choose to wash your hair, wash your hair first as usual with your  normal  shampoo.  3.  After you shampoo, rinse your hair and body thoroughly to remove the  shampoo.                           4.  Use CHG as you would any other liquid soap.  You can apply chg directly  to the skin and wash                       Gently with a scrungie or clean washcloth.  5.  Apply the CHG Soap to your body ONLY FROM THE NECK DOWN.   Do not use on face/ open                           Wound or open sores. Avoid contact with eyes,  ears mouth and genitals (private parts).                       Wash face,  Genitals (private parts) with your normal soap.             6.  Wash thoroughly, paying special attention to the area where your surgery  will be performed.  7.  Thoroughly rinse your body with warm water from the neck down.  8.  DO NOT shower/wash with your normal soap after using and rinsing off  the CHG Soap.                9.  Pat yourself dry with a clean towel.            10.  Wear clean pajamas.            11.  Place clean sheets on your bed the night of your first shower and do not  sleep with pets. Day of Surgery : Do not apply any lotions/deodorants the morning of surgery.  Please wear clean clothes to the hospital/surgery center.  FAILURE TO FOLLOW THESE INSTRUCTIONS MAY RESULT IN THE CANCELLATION OF YOUR SURGERY PATIENT SIGNATURE_________________________________  NURSE SIGNATURE__________________________________  ________________________________________________________________________   Laura Sanders  An incentive spirometer is a tool that can help keep your lungs clear and active. This tool measures how well you are filling your lungs with each breath. Taking long deep breaths may help reverse or decrease the chance of developing breathing (pulmonary) problems (especially infection) following:  A long period of time when you are unable to move or be active. BEFORE THE PROCEDURE   If the spirometer includes an indicator to show your best effort, your nurse or respiratory therapist will set it to a desired goal.  If possible, sit up straight or lean slightly forward. Try not to slouch.  Hold the incentive spirometer in an upright position. INSTRUCTIONS FOR USE  1. Sit on the edge of your bed if possible, or sit up as far as you can in bed or on a chair. 2. Hold the incentive spirometer in an upright position. 3. Breathe out normally. 4. Place the mouthpiece in your mouth and seal your lips  tightly around it. 5. Breathe in slowly and as deeply as possible, raising the piston or the ball toward the top of the column. 6. Hold your breath for 3-5 seconds or for as long as possible. Allow the piston or ball to fall to the bottom of the column. 7. Remove the mouthpiece from your mouth and breathe out normally. 8. Rest for a few seconds and repeat Steps 1 through 7 at least 10 times every 1-2 hours when you are awake. Take your time and take a few normal breaths between deep breaths. 9. The spirometer may include an indicator to show your best effort. Use the indicator as a goal to work toward during each repetition. 10. After  each set of 10 deep breaths, practice coughing to be sure your lungs are clear. If you have an incision (the cut made at the time of surgery), support your incision when coughing by placing a pillow or rolled up towels firmly against it. Once you are able to get out of bed, walk around indoors and cough well. You may stop using the incentive spirometer when instructed by your caregiver.  RISKS AND COMPLICATIONS  Take your time so you do not get dizzy or light-headed.  If you are in pain, you may need to take or ask for pain medication before doing incentive spirometry. It is harder to take a deep breath if you are having pain. AFTER USE  Rest and breathe slowly and easily.  It can be helpful to keep track of a log of your progress. Your caregiver can provide you with a simple table to help with this. If you are using the spirometer at home, follow these instructions: [place] IF:   You are having difficultly using the spirometer.  You have trouble using the spirometer as often as instructed.  Your pain medication is not giving enough relief while using the spirometer.  You develop fever of 100.5 F (38.1 C) or higher. SEEK IMMEDIATE MEDICAL CARE IF:   You cough up bloody sputum that had not been present before.  You develop fever of 102 F  (38.9 C) or greater.  You develop worsening pain at or near the incision site. MAKE SURE YOU:   Understand these instructions.  Will watch your condition.  Will get help right away if you are not doing well or get worse. Document Released: 10/11/2006 Document Revised: 08/23/2011 Document Reviewed: 12/12/2006 ExitCare Patient Information 2014 ExitCare, [place].   ________________________________________________________________________  WHAT IS A BLOOD TRANSFUSION? Blood Transfusion Information  A transfusion is the replacement of blood or some of its parts. Blood is made up of multiple cells which provide different functions.  Red blood cells carry oxygen and are used for blood loss replacement.  White blood cells fight against infection.  Platelets control bleeding.  Plasma helps clot blood.  Other blood products are available for specialized needs, such as hemophilia or other clotting disorders. BEFORE THE TRANSFUSION  Who gives blood for transfusions?   Healthy volunteers who are fully evaluated to make sure their blood is safe. This is blood bank blood. Transfusion therapy is the safest it has ever been in the practice of medicine. Before blood is taken from a donor, a complete history is taken to make sure that person has no history of diseases nor engages in risky social behavior (examples are intravenous drug use or sexual activity with multiple partners). The donor's travel history is screened to minimize risk of transmitting infections, such as malaria. The donated blood is tested for signs of infectious diseases, such as HIV and hepatitis. The blood is then tested to be sure it is compatible with you in order to minimize the chance of a transfusion reaction. If you or a relative donates blood, this is often done in anticipation of surgery and is not appropriate for emergency situations. It takes many days to process the donated blood. RISKS AND COMPLICATIONS Although  transfusion therapy is very safe and saves many lives, the main dangers of transfusion include:   Getting an infectious disease.  Developing a transfusion reaction. This is an allergic reaction to something in the blood you were given. Every precaution is taken to prevent this. The decision to  have a blood transfusion has been considered carefully by your caregiver before blood is given. Blood is not given unless the benefits outweigh the risks. AFTER THE TRANSFUSION  Right after receiving a blood transfusion, you will usually feel much better and more energetic. This is especially true if your red blood cells have gotten low (anemic). The transfusion raises the level of the red blood cells which carry oxygen, and this usually causes an energy increase.  The nurse administering the transfusion will monitor you carefully for complications. HOME CARE INSTRUCTIONS  No special instructions are needed after a transfusion. You may find your energy is better. Speak with your caregiver about any limitations on activity for underlying diseases you may have. SEEK MEDICAL CARE IF:   Your condition is not improving after your transfusion.  You develop redness or irritation at the intravenous (IV) site. SEEK IMMEDIATE MEDICAL CARE IF:  Any of the following symptoms occur over the next 12 hours:  Shaking chills.  You have a temperature by mouth above 102 F (38.9 C), not controlled by medicine.  Chest, back, or muscle pain.  People around you feel you are not acting correctly or are confused.  Shortness of breath or difficulty breathing.  Dizziness and fainting.  You get a rash or develop hives.  You have a decrease in urine output.  Your urine turns a dark color or changes to pink, red, or brown. Any of the following symptoms occur over the next 10 days:  You have a temperature by mouth above 102 F (38.9 C), not controlled by medicine.  Shortness of breath.  Weakness after normal  activity.  The white part of the eye turns yellow (jaundice).  You have a decrease in the amount of urine or are urinating less often.  Your urine turns a dark color or changes to pink, red, or brown. Document Released: 05/28/2000 Document Revised: 08/23/2011 Document Reviewed: 01/15/2008 [facility] Patient Information 2014 [place], [place].  _______________________________________________________________________

## 2018-04-02 MED ORDER — BUPIVACAINE LIPOSOME 1.3 % IJ SUSP
20.0000 mL | INTRAMUSCULAR | Status: DC
  Filled 2018-04-02: qty 20

## 2018-04-03 ENCOUNTER — Other Ambulatory Visit: Payer: Self-pay

## 2018-04-03 ENCOUNTER — Inpatient Hospital Stay (HOSPITAL_COMMUNITY): Payer: Medicare Other | Admitting: Anesthesiology

## 2018-04-03 ENCOUNTER — Inpatient Hospital Stay (HOSPITAL_COMMUNITY)
Admission: RE | Admit: 2018-04-03 | Discharge: 2018-04-06 | DRG: 470 | Disposition: A | Discharge status: Home / Self Care | Payer: Medicare Other | Source: Ambulatory Visit | Attending: Orthopedic Surgery | Admitting: Orthopedic Surgery

## 2018-04-03 ENCOUNTER — Encounter (HOSPITAL_COMMUNITY): Payer: Self-pay | Admitting: Emergency Medicine

## 2018-04-03 ENCOUNTER — Encounter (HOSPITAL_COMMUNITY)
Admission: RE | Disposition: A | Discharge status: Home / Self Care | Payer: Self-pay | Source: Ambulatory Visit | Attending: Orthopedic Surgery

## 2018-04-03 DIAGNOSIS — D696 Thrombocytopenia, unspecified: Secondary | ICD-10-CM | POA: Diagnosis present

## 2018-04-03 DIAGNOSIS — F329 Major depressive disorder, single episode, unspecified: Secondary | ICD-10-CM | POA: Diagnosis present

## 2018-04-03 DIAGNOSIS — F419 Anxiety disorder, unspecified: Secondary | ICD-10-CM | POA: Diagnosis present

## 2018-04-03 DIAGNOSIS — E7800 Pure hypercholesterolemia, unspecified: Secondary | ICD-10-CM | POA: Diagnosis present

## 2018-04-03 DIAGNOSIS — I10 Essential (primary) hypertension: Secondary | ICD-10-CM | POA: Diagnosis present

## 2018-04-03 DIAGNOSIS — M1710 Unilateral primary osteoarthritis, unspecified knee: Secondary | ICD-10-CM

## 2018-04-03 DIAGNOSIS — Z79899 Other long term (current) drug therapy: Secondary | ICD-10-CM

## 2018-04-03 DIAGNOSIS — Z888 Allergy status to other drugs, medicaments and biological substances status: Secondary | ICD-10-CM

## 2018-04-03 DIAGNOSIS — Z79818 Long term (current) use of other agents affecting estrogen receptors and estrogen levels: Secondary | ICD-10-CM | POA: Diagnosis not present

## 2018-04-03 DIAGNOSIS — M1711 Unilateral primary osteoarthritis, right knee: Secondary | ICD-10-CM | POA: Diagnosis present

## 2018-04-03 DIAGNOSIS — M179 Osteoarthritis of knee, unspecified: Secondary | ICD-10-CM

## 2018-04-03 HISTORY — PX: TOTAL KNEE ARTHROPLASTY: SHX125

## 2018-04-03 LAB — CBC
HCT: 37.4 % (ref 36.0–46.0)
Hemoglobin: 12.3 g/dL (ref 12.0–15.0)
MCH: 30.2 pg (ref 26.0–34.0)
MCHC: 32.9 g/dL (ref 30.0–36.0)
MCV: 91.9 fL (ref 80.0–100.0)
PLATELETS: 78 10*3/uL — AB (ref 150–400)
RBC: 4.07 MIL/uL (ref 3.87–5.11)
RDW: 11.5 % (ref 11.5–15.5)
WBC: 7.9 10*3/uL (ref 4.0–10.5)
nRBC: 0 % (ref 0.0–0.2)

## 2018-04-03 LAB — TYPE AND SCREEN
ABO/RH(D): B POS
ANTIBODY SCREEN: NEGATIVE

## 2018-04-03 SURGERY — ARTHROPLASTY, KNEE, TOTAL
Anesthesia: Spinal | Site: Knee | Laterality: Right

## 2018-04-03 MED ORDER — PROPOFOL 500 MG/50ML IV EMUL
INTRAVENOUS | Status: DC | PRN
  Administered 2018-04-03: 25 mg via INTRAVENOUS

## 2018-04-03 MED ORDER — ESTRADIOL 1 MG PO TABS
2.0000 mg | ORAL_TABLET | Freq: Every day | ORAL | Status: DC
  Administered 2018-04-03 – 2018-04-06 (×4): 2 mg via ORAL
  Filled 2018-04-03 (×5): qty 2

## 2018-04-03 MED ORDER — VENLAFAXINE HCL ER 75 MG PO CP24
225.0000 mg | ORAL_CAPSULE | Freq: Every day | ORAL | Status: DC
  Administered 2018-04-04 – 2018-04-06 (×3): 225 mg via ORAL
  Filled 2018-04-03 (×3): qty 3

## 2018-04-03 MED ORDER — SODIUM CHLORIDE 0.9 % IJ SOLN
INTRAMUSCULAR | Status: AC
  Filled 2018-04-03: qty 10

## 2018-04-03 MED ORDER — SODIUM CHLORIDE 0.9 % IR SOLN
Status: DC | PRN
  Administered 2018-04-03: 1000 mL

## 2018-04-03 MED ORDER — OXYCODONE HCL 5 MG PO TABS
5.0000 mg | ORAL_TABLET | ORAL | Status: DC | PRN
  Administered 2018-04-03 – 2018-04-04 (×5): 10 mg via ORAL
  Administered 2018-04-04: 5 mg via ORAL
  Administered 2018-04-05 – 2018-04-06 (×8): 10 mg via ORAL
  Filled 2018-04-03 (×9): qty 2
  Filled 2018-04-03: qty 1
  Filled 2018-04-03 (×4): qty 2

## 2018-04-03 MED ORDER — DEXAMETHASONE SODIUM PHOSPHATE 10 MG/ML IJ SOLN
8.0000 mg | Freq: Once | INTRAMUSCULAR | Status: AC
  Administered 2018-04-03: 8 mg via INTRAVENOUS

## 2018-04-03 MED ORDER — ROPIVACAINE HCL 7.5 MG/ML IJ SOLN
INTRAMUSCULAR | Status: DC | PRN
  Administered 2018-04-03 (×6): 5 mL via PERINEURAL

## 2018-04-03 MED ORDER — FENTANYL CITRATE (PF) 250 MCG/5ML IJ SOLN
INTRAMUSCULAR | Status: AC
  Filled 2018-04-03: qty 5

## 2018-04-03 MED ORDER — SODIUM CHLORIDE 0.9 % IJ SOLN
INTRAMUSCULAR | Status: DC | PRN
  Administered 2018-04-03: 60 mL via INTRAVENOUS

## 2018-04-03 MED ORDER — BUPIVACAINE LIPOSOME 1.3 % IJ SUSP
INTRAMUSCULAR | Status: DC | PRN
  Administered 2018-04-03: 20 mL

## 2018-04-03 MED ORDER — ONDANSETRON HCL 4 MG/2ML IJ SOLN
INTRAMUSCULAR | Status: DC | PRN
  Administered 2018-04-03: 4 mg via INTRAVENOUS

## 2018-04-03 MED ORDER — MIDAZOLAM HCL 2 MG/2ML IJ SOLN
1.0000 mg | Freq: Once | INTRAMUSCULAR | Status: AC
  Administered 2018-04-03 (×2): 1 mg via INTRAVENOUS
  Filled 2018-04-03: qty 2

## 2018-04-03 MED ORDER — ONDANSETRON HCL 4 MG/2ML IJ SOLN: 4.0000 mg | Freq: Four times a day (QID) | INTRAMUSCULAR | Status: DC | PRN

## 2018-04-03 MED ORDER — BISACODYL 10 MG RE SUPP: 10.0000 mg | Freq: Every day | RECTAL | Status: DC | PRN

## 2018-04-03 MED ORDER — PROPOFOL 500 MG/50ML IV EMUL: INTRAVENOUS | Status: DC | PRN

## 2018-04-03 MED ORDER — TRANEXAMIC ACID-NACL 1000-0.7 MG/100ML-% IV SOLN
1000.0000 mg | Freq: Once | INTRAVENOUS | Status: AC
  Administered 2018-04-03: 1000 mg via INTRAVENOUS
  Filled 2018-04-03: qty 100

## 2018-04-03 MED ORDER — MEPERIDINE HCL 50 MG/ML IJ SOLN: 6.2500 mg | INTRAMUSCULAR | Status: DC | PRN

## 2018-04-03 MED ORDER — CEFAZOLIN SODIUM-DEXTROSE 1-4 GM/50ML-% IV SOLN
1.0000 g | Freq: Four times a day (QID) | INTRAVENOUS | Status: AC
  Administered 2018-04-03 – 2018-04-04 (×2): 1 g via INTRAVENOUS
  Filled 2018-04-03 (×2): qty 50

## 2018-04-03 MED ORDER — ACETAMINOPHEN 500 MG PO TABS
1000.0000 mg | ORAL_TABLET | Freq: Four times a day (QID) | ORAL | Status: AC
  Administered 2018-04-03 – 2018-04-04 (×4): 1000 mg via ORAL
  Filled 2018-04-03 (×5): qty 2

## 2018-04-03 MED ORDER — BISOPROLOL FUMARATE 5 MG PO TABS
5.0000 mg | ORAL_TABLET | Freq: Once | ORAL | Status: AC
  Administered 2018-04-03: 5 mg via ORAL
  Filled 2018-04-03: qty 1

## 2018-04-03 MED ORDER — METOCLOPRAMIDE HCL 5 MG PO TABS: 5.0000 mg | ORAL_TABLET | Freq: Three times a day (TID) | ORAL | Status: DC | PRN

## 2018-04-03 MED ORDER — FENTANYL CITRATE (PF) 100 MCG/2ML IJ SOLN
50.0000 ug | Freq: Once | INTRAMUSCULAR | Status: AC
  Administered 2018-04-03 (×2): 50 ug via INTRAVENOUS
  Filled 2018-04-03: qty 2

## 2018-04-03 MED ORDER — ALPRAZOLAM 0.5 MG PO TABS
0.5000 mg | ORAL_TABLET | Freq: Three times a day (TID) | ORAL | Status: DC | PRN
  Administered 2018-04-05 (×2): 0.5 mg via ORAL
  Filled 2018-04-03 (×2): qty 1

## 2018-04-03 MED ORDER — SCOPOLAMINE 1 MG/3DAYS TD PT72
MEDICATED_PATCH | TRANSDERMAL | Status: AC
  Filled 2018-04-03: qty 1

## 2018-04-03 MED ORDER — GABAPENTIN 300 MG PO CAPS
300.0000 mg | ORAL_CAPSULE | Freq: Three times a day (TID) | ORAL | Status: DC
  Administered 2018-04-03 – 2018-04-06 (×8): 300 mg via ORAL
  Filled 2018-04-03 (×8): qty 1

## 2018-04-03 MED ORDER — HYDROMORPHONE HCL 1 MG/ML IJ SOLN
0.2500 mg | INTRAMUSCULAR | Status: DC | PRN
  Administered 2018-04-03 (×3): 0.5 mg via INTRAVENOUS

## 2018-04-03 MED ORDER — FLEET ENEMA 7-19 GM/118ML RE ENEM: 1.0000 | ENEMA | Freq: Once | RECTAL | Status: DC | PRN

## 2018-04-03 MED ORDER — METHOCARBAMOL 500 MG IVPB - SIMPLE MED
INTRAVENOUS | Status: AC
  Filled 2018-04-03: qty 50

## 2018-04-03 MED ORDER — SODIUM CHLORIDE 0.9 % IV SOLN
INTRAVENOUS | Status: DC | PRN
  Administered 2018-04-03: 25 ug/min via INTRAVENOUS

## 2018-04-03 MED ORDER — BISOPROLOL-HYDROCHLOROTHIAZIDE 5-6.25 MG PO TABS
1.0000 | ORAL_TABLET | Freq: Two times a day (BID) | ORAL | Status: DC
  Administered 2018-04-03: 1 via ORAL
  Filled 2018-04-03 (×2): qty 1

## 2018-04-03 MED ORDER — MORPHINE SULFATE (PF) 2 MG/ML IV SOLN
1.0000 mg | INTRAVENOUS | Status: DC | PRN
  Administered 2018-04-04 – 2018-04-05 (×3): 2 mg via INTRAVENOUS
  Filled 2018-04-03 (×3): qty 1

## 2018-04-03 MED ORDER — PROPOFOL 10 MG/ML IV BOLUS
INTRAVENOUS | Status: AC
  Filled 2018-04-03: qty 20

## 2018-04-03 MED ORDER — ONDANSETRON HCL 4 MG PO TABS: 4.0000 mg | ORAL_TABLET | Freq: Four times a day (QID) | ORAL | Status: DC | PRN

## 2018-04-03 MED ORDER — LACTATED RINGERS IV SOLN
INTRAVENOUS | Status: DC
  Administered 2018-04-03 (×2): via INTRAVENOUS

## 2018-04-03 MED ORDER — DIPHENHYDRAMINE HCL 12.5 MG/5ML PO ELIX: 12.5000 mg | ORAL_SOLUTION | ORAL | Status: DC | PRN

## 2018-04-03 MED ORDER — METHOCARBAMOL 500 MG IVPB - SIMPLE MED
500.0000 mg | Freq: Four times a day (QID) | INTRAVENOUS | Status: DC | PRN
  Administered 2018-04-03: 500 mg via INTRAVENOUS
  Filled 2018-04-03: qty 50

## 2018-04-03 MED ORDER — SODIUM CHLORIDE 0.9 % IJ SOLN
INTRAMUSCULAR | Status: AC
  Filled 2018-04-03: qty 50

## 2018-04-03 MED ORDER — PROPOFOL 500 MG/50ML IV EMUL
INTRAVENOUS | Status: DC | PRN
  Administered 2018-04-03: 75 ug/kg/min via INTRAVENOUS

## 2018-04-03 MED ORDER — SODIUM CHLORIDE 0.9 % IV SOLN
INTRAVENOUS | Status: DC
  Administered 2018-04-03: 19:00:00 via INTRAVENOUS

## 2018-04-03 MED ORDER — TRANEXAMIC ACID-NACL 1000-0.7 MG/100ML-% IV SOLN
1000.0000 mg | INTRAVENOUS | Status: AC
  Administered 2018-04-03: 1000 mg via INTRAVENOUS
  Filled 2018-04-03: qty 100

## 2018-04-03 MED ORDER — MENTHOL 3 MG MT LOZG: 1.0000 | LOZENGE | OROMUCOSAL | Status: DC | PRN

## 2018-04-03 MED ORDER — STERILE WATER FOR IRRIGATION IR SOLN
Status: DC | PRN
  Administered 2018-04-03: 2000 mL

## 2018-04-03 MED ORDER — SCOPOLAMINE 1 MG/3DAYS TD PT72
MEDICATED_PATCH | TRANSDERMAL | Status: DC | PRN
  Administered 2018-04-03: 1 via TRANSDERMAL

## 2018-04-03 MED ORDER — DEXAMETHASONE SODIUM PHOSPHATE 10 MG/ML IJ SOLN
10.0000 mg | Freq: Once | INTRAMUSCULAR | Status: AC
  Administered 2018-04-04: 10 mg via INTRAVENOUS
  Filled 2018-04-03: qty 1

## 2018-04-03 MED ORDER — PHENYLEPHRINE HCL 10 MG/ML IJ SOLN
INTRAMUSCULAR | Status: AC
  Filled 2018-04-03: qty 1

## 2018-04-03 MED ORDER — CHLORHEXIDINE GLUCONATE 4 % EX LIQD: 60.0000 mL | Freq: Once | CUTANEOUS | Status: DC

## 2018-04-03 MED ORDER — HYDROMORPHONE HCL 1 MG/ML IJ SOLN
INTRAMUSCULAR | Status: AC
  Filled 2018-04-03: qty 1

## 2018-04-03 MED ORDER — KETOROLAC TROMETHAMINE 30 MG/ML IJ SOLN: 30.0000 mg | Freq: Once | INTRAMUSCULAR | Status: DC | PRN

## 2018-04-03 MED ORDER — GABAPENTIN 300 MG PO CAPS
300.0000 mg | ORAL_CAPSULE | Freq: Once | ORAL | Status: AC
  Administered 2018-04-03: 300 mg via ORAL
  Filled 2018-04-03: qty 1

## 2018-04-03 MED ORDER — PROMETHAZINE HCL 25 MG/ML IJ SOLN: 6.2500 mg | INTRAMUSCULAR | Status: DC | PRN

## 2018-04-03 MED ORDER — TRAZODONE HCL 100 MG PO TABS
100.0000 mg | ORAL_TABLET | Freq: Every day | ORAL | Status: DC
  Administered 2018-04-03 – 2018-04-05 (×3): 100 mg via ORAL
  Filled 2018-04-03 (×3): qty 1

## 2018-04-03 MED ORDER — POLYETHYLENE GLYCOL 3350 17 G PO PACK
17.0000 g | PACK | Freq: Every day | ORAL | Status: DC | PRN
  Administered 2018-04-06: 17 g via ORAL
  Filled 2018-04-03: qty 1

## 2018-04-03 MED ORDER — PROPOFOL 10 MG/ML IV BOLUS
INTRAVENOUS | Status: AC
  Filled 2018-04-03: qty 40

## 2018-04-03 MED ORDER — METOCLOPRAMIDE HCL 5 MG/ML IJ SOLN: 5.0000 mg | Freq: Three times a day (TID) | INTRAMUSCULAR | Status: DC | PRN

## 2018-04-03 MED ORDER — METHOCARBAMOL 500 MG PO TABS
500.0000 mg | ORAL_TABLET | Freq: Four times a day (QID) | ORAL | Status: DC | PRN
  Administered 2018-04-04 – 2018-04-06 (×7): 500 mg via ORAL
  Filled 2018-04-03 (×8): qty 1

## 2018-04-03 MED ORDER — ACETAMINOPHEN 10 MG/ML IV SOLN
1000.0000 mg | Freq: Four times a day (QID) | INTRAVENOUS | Status: DC
  Administered 2018-04-03: 1000 mg via INTRAVENOUS
  Filled 2018-04-03: qty 100

## 2018-04-03 MED ORDER — DOCUSATE SODIUM 100 MG PO CAPS
100.0000 mg | ORAL_CAPSULE | Freq: Two times a day (BID) | ORAL | Status: DC
  Administered 2018-04-03 – 2018-04-06 (×6): 100 mg via ORAL
  Filled 2018-04-03 (×7): qty 1

## 2018-04-03 MED ORDER — CEFAZOLIN SODIUM-DEXTROSE 2-4 GM/100ML-% IV SOLN
2.0000 g | INTRAVENOUS | Status: AC
  Administered 2018-04-03: 2 g via INTRAVENOUS
  Filled 2018-04-03: qty 100

## 2018-04-03 MED ORDER — PHENOL 1.4 % MT LIQD
1.0000 | OROMUCOSAL | Status: DC | PRN
  Filled 2018-04-03: qty 177

## 2018-04-03 MED ORDER — RIVAROXABAN 10 MG PO TABS
10.0000 mg | ORAL_TABLET | Freq: Every day | ORAL | Status: DC
  Administered 2018-04-04 – 2018-04-06 (×3): 10 mg via ORAL
  Filled 2018-04-03 (×3): qty 1

## 2018-04-03 SURGICAL SUPPLY — 60 items
ATTUNE MED DOME PAT 38 KNEE (Knees) ×1 IMPLANT
ATTUNE PS FEM RT SZ 5 CEM KNEE (Femur) ×1 IMPLANT
ATTUNE PSRP INSR SZ 5 10M KNEE (Insert) ×1 IMPLANT
BAG SPEC THK2 15X12 ZIP CLS (MISCELLANEOUS) ×1
BAG ZIPLOCK 12X15 (MISCELLANEOUS) ×2 IMPLANT
BANDAGE ACE 6X5 VEL STRL LF (GAUZE/BANDAGES/DRESSINGS) ×2 IMPLANT
BANDAGE ELASTIC 6 VELCRO ST LF (GAUZE/BANDAGES/DRESSINGS) ×1 IMPLANT
BASEPLATE TIBIAL ROTATING SZ 4 (Knees) ×1 IMPLANT
BLADE SAG 18X100X1.27 (BLADE) ×2 IMPLANT
BLADE SAW SGTL 11.0X1.19X90.0M (BLADE) ×2 IMPLANT
BOWL SMART MIX CTS (DISPOSABLE) ×2 IMPLANT
BSPLAT TIB 4 CMNT ROT PLAT STR (Knees) ×1 IMPLANT
CEMENT HV SMART SET (Cement) ×4 IMPLANT
COVER SURGICAL LIGHT HANDLE (MISCELLANEOUS) ×2 IMPLANT
COVER WAND RF STERILE (DRAPES) ×1 IMPLANT
CUFF TOURN SGL QUICK 34 (TOURNIQUET CUFF) ×2
CUFF TRNQT CYL 34X4X40X1 (TOURNIQUET CUFF) ×1 IMPLANT
DECANTER SPIKE VIAL GLASS SM (MISCELLANEOUS) ×2 IMPLANT
DRAPE U-SHAPE 47X51 STRL (DRAPES) ×2 IMPLANT
DRSG ADAPTIC 3X8 NADH LF (GAUZE/BANDAGES/DRESSINGS) ×2 IMPLANT
DRSG EMULSION OIL 3X16 NADH (GAUZE/BANDAGES/DRESSINGS) ×1 IMPLANT
DRSG PAD ABDOMINAL 8X10 ST (GAUZE/BANDAGES/DRESSINGS) ×2 IMPLANT
DURAPREP 26ML APPLICATOR (WOUND CARE) ×2 IMPLANT
ELECT REM PT RETURN 15FT ADLT (MISCELLANEOUS) ×2 IMPLANT
EVACUATOR 1/8 PVC DRAIN (DRAIN) ×2 IMPLANT
GAUZE SPONGE 4X4 12PLY STRL (GAUZE/BANDAGES/DRESSINGS) ×2 IMPLANT
GLOVE BIO SURGEON STRL SZ7 (GLOVE) ×2 IMPLANT
GLOVE BIO SURGEON STRL SZ8 (GLOVE) ×2 IMPLANT
GLOVE BIOGEL PI IND STRL 6.5 (GLOVE) ×1 IMPLANT
GLOVE BIOGEL PI IND STRL 7.0 (GLOVE) ×1 IMPLANT
GLOVE BIOGEL PI IND STRL 8 (GLOVE) ×1 IMPLANT
GLOVE BIOGEL PI INDICATOR 6.5 (GLOVE) ×1
GLOVE BIOGEL PI INDICATOR 7.0 (GLOVE) ×1
GLOVE BIOGEL PI INDICATOR 8 (GLOVE) ×1
GLOVE SURG SS PI 6.5 STRL IVOR (GLOVE) ×2 IMPLANT
GOWN STRL REUS W/TWL LRG LVL3 (GOWN DISPOSABLE) ×4 IMPLANT
GOWN STRL REUS W/TWL XL LVL3 (GOWN DISPOSABLE) ×2 IMPLANT
HANDPIECE INTERPULSE COAX TIP (DISPOSABLE) ×2
HOLDER FOLEY CATH W/STRAP (MISCELLANEOUS) IMPLANT
IMMOBILIZER KNEE 20 (SOFTGOODS) ×3 IMPLANT
IMMOBILIZER KNEE 20 THIGH 36 (SOFTGOODS) ×1 IMPLANT
MANIFOLD NEPTUNE II (INSTRUMENTS) ×2 IMPLANT
NS IRRIG 1000ML POUR BTL (IV SOLUTION) ×2 IMPLANT
PACK TOTAL KNEE CUSTOM (KITS) ×2 IMPLANT
PAD ABD 7.5X8 STRL (GAUZE/BANDAGES/DRESSINGS) ×1 IMPLANT
PADDING CAST COTTON 6X4 STRL (CAST SUPPLIES) ×4 IMPLANT
PIN STEINMAN FIXATION KNEE (PIN) ×1 IMPLANT
PIN THREADED HEADED SIGMA (PIN) ×1 IMPLANT
POSITIONER SURGICAL ARM (MISCELLANEOUS) ×2 IMPLANT
SET HNDPC FAN SPRY TIP SCT (DISPOSABLE) ×1 IMPLANT
STRIP CLOSURE SKIN 1/2X4 (GAUZE/BANDAGES/DRESSINGS) ×2 IMPLANT
SUT MNCRL AB 4-0 PS2 18 (SUTURE) ×1 IMPLANT
SUT STRATAFIX 0 PDS 27 VIOLET (SUTURE) ×2
SUT VIC AB 2-0 CT1 27 (SUTURE) ×6
SUT VIC AB 2-0 CT1 TAPERPNT 27 (SUTURE) ×3 IMPLANT
SUTURE STRATFX 0 PDS 27 VIOLET (SUTURE) ×1 IMPLANT
TRAY FOLEY MTR SLVR 16FR STAT (SET/KITS/TRAYS/PACK) ×2 IMPLANT
WATER STERILE IRR 1000ML POUR (IV SOLUTION) ×4 IMPLANT
WRAP KNEE MAXI GEL POST OP (GAUZE/BANDAGES/DRESSINGS) ×2 IMPLANT
YANKAUER SUCT BULB TIP 10FT TU (MISCELLANEOUS) ×2 IMPLANT

## 2018-04-03 NOTE — Anesthesia Procedure Notes (Signed)
Anesthesia Regional Block: Adductor canal block   Pre-Anesthetic Checklist: ,, timeout performed, Correct Patient, Correct Site, Correct Laterality, Correct Procedure, Correct Position, site marked, Risks and benefits discussed,  Surgical consent,  Pre-op evaluation,  At surgeon's request and post-op pain management  Laterality: Right and Lower  Prep: chloraprep       Needles:  Injection technique: Single-shot  Needle Type: Echogenic Stimulator Needle     Needle Length: 10cm  Needle Gauge: 21   Needle insertion depth: 2 cm   Additional Needles:   Procedures:,,,, ultrasound used (permanent image in chart),,,,  Narrative:  Start time: 04/03/2018 2:33 PM End time: 04/03/2018 2:41 PM Injection made incrementally with aspirations every 5 mL.  Performed by: Personally  Anesthesiologist: Leilani Able, MD

## 2018-04-03 NOTE — Anesthesia Procedure Notes (Signed)
Date/Time: 04/03/2018 2:58 PM Performed by: Thornell Mule, CRNA Oxygen Delivery Method: Simple face mask

## 2018-04-03 NOTE — Anesthesia Procedure Notes (Signed)
Spinal  Patient location during procedure: OR Start time: 04/03/2018 3:03 PM End time: 04/03/2018 3:06 PM Staffing Anesthesiologist: Leilani Able, MD Performed: anesthesiologist  Preanesthetic Checklist Completed: patient identified, site marked, surgical consent, pre-op evaluation, timeout performed, IV checked, risks and benefits discussed and monitors and equipment checked Spinal Block Patient position: sitting Prep: site prepped and draped and DuraPrep Patient monitoring: continuous pulse ox and blood pressure Approach: midline Location: L3-4 Injection technique: single-shot Needle Needle type: Pencan  Needle gauge: 24 G Needle length: 10 cm Needle insertion depth: 4 cm Assessment Sensory level: T8

## 2018-04-03 NOTE — Progress Notes (Signed)
Dr. Arby Barrette notified of platelet count 78. No new orders.

## 2018-04-03 NOTE — Op Note (Signed)
OPERATIVE REPORT-TOTAL KNEE ARTHROPLASTY   Pre-operative diagnosis- Osteoarthritis  Right knee(s)  Post-operative diagnosis- Osteoarthritis Right knee(s)  Procedure-  Right  Total Knee Arthroplasty (Depuy Attune)  Surgeon- Rheta Hemmelgarn V. Layten Aiken, MD  Assistant- Amber Constable, PA-C   Anesthesia-  Adductor canal block and spinal  EBL-25 ml   Drains Hemovac  Tourniquet time-  Total Tourniquet Time Documented: Thigh (Right) - 32 minutes Total: Thigh (Right) - 32 minutes     Complications- None  Condition-PACU - hemodynamically stable.   Brief Clinical Note  Laura Sanders is a 67 y.o. year old female with end stage OA of her right knee with progressively worsening pain and dysfunction. She has constant pain, with activity and at rest and significant functional deficits with difficulties even with ADLs. She has had extensive non-op management including analgesics, injections of cortisone and viscosupplements, and home exercise program, but remains in significant pain with significant dysfunction.Radiographs show bone on bone arthritis medial and patellofemoral. She presents now for right Total Knee Arthroplasty.    Procedure in detail---   The patient is brought into the operating room and positioned supine on the operating table. After successful administration of   Adductor canal block and spinal,   a tourniquet is placed high on the  Right thigh(s) and the lower extremity is prepped and draped in the usual sterile fashion. Time out is performed by the operating team and then the  Right lower extremity is wrapped in Esmarch, knee flexed and the tourniquet inflated to 300 mmHg.       A midline incision is made with a ten blade through the subcutaneous tissue to the level of the extensor mechanism. A fresh blade is used to make a medial parapatellar arthrotomy. Soft tissue over the proximal medial tibia is subperiosteally elevated to the joint line with a knife and into the865Gregor Hamssus bursa with a Cobb elevator. Soft tissue over the proximal3mOwens Co5 t appropriate size for the tibia and the proximal tibia is prepared with the modular drill and keel punch for that size.      The femoral sizing guide is placed and size 5 is most appropriate. Rotation is marked off the epicondylar axis and confirmed by creating a rectangular flexion gap at 90 degrees. The size 5 cutting block is pinned in this rotation and the anterior, posterior and chamfer cuts are made with the oscillating saw. The intercondylar block is then placed and that cut is made.  Trial size 4 tibial component, trial size 5 posterior stabilized femur and a 10  mm posterior stabilized rotating platform insert trial is placed. Full extension is achieved with excellent varus/valgus and anterior/posterior balance throughout full range of motion. The patella is everted and thickness measured to be 22  mm. Free hand resection is taken to 12 mm, a  38 template is placed, lug holes are drilled, trial patella is placed, and it tracks normally. Osteophytes are removed off the posterior femur with the trial in place. All trials are removed and the cut bone surfaces prepared with pulsatile lavage. Cement is mixed and once ready for implantation, the size 4 tibial implant, size  5 posterior stabilized femoral component, and the size 38 patella are cemented in place and the patella is held with the clamp. The trial insert is placed and the knee held in full extension. The Exparel (20 ml mixed with 60 ml saline) is injected into the extensor mechanism, posterior capsule, medial and lateral gutters and subcutaneous tissues.  All extruded cement is removed and once the cement is hard the permanent 10 mm posterior stabilized rotating platform insert is placed into the tibial tray.      The wound is copiously irrigated with saline solution and the extensor mechanism closed over a hemovac drain with #1 V-loc suture. The tourniquet is released for a total tourniquet time of 32  minutes. Flexion against gravity is 140 degrees and the patella tracks normally. Subcutaneous tissue is closed with 2.0 vicryl and subcuticular with running 4.0 Monocryl. The incision is cleaned and dried and steri-strips and a bulky sterile dressing are applied. The limb is placed into a knee immobilizer and the patient is awakened and transported to recovery in stable condition.      Please note that a surgical assistant was a medical necessity for this procedure in order to perform it in a safe and expeditious manner. Surgical assistant was necessary to retract the ligaments and vital neurovascular structures to prevent injury to them and also necessary for proper positioning of the limb to allow for anatomic placement of the prosthesis.   Laura Rankin Pernell Lenoir, MD    04/03/2018, 4:14 PM

## 2018-04-03 NOTE — Care Plan (Signed)
Ortho Bundle Case Management Note  Patient Details  Name: Laura Sanders MRN: [id number] Date of Birth: 08/21/1950                     DME Arranged:  N/A(Has a standard walker and 3-in-1) DME Agency:  NA  HH Arranged:  NA(OP PT scheduled at [facility] on 04-07-18) HH Agency:  NA  Additional Comments: Please contact me with any questions of if this plan should need to change.  Ennis Forts, RN,CCM [facility]  [phone] 04/03/2018, 12:48 PM

## 2018-04-03 NOTE — Anesthesia Postprocedure Evaluation (Signed)
Anesthesia Post Note  Patient: Laura Sanders  Procedure(s) Performed: RIGHT TOTAL KNEE ARTHROPLASTY (Right Knee)     Anesthesia Type: Spinal Level of consciousness: awake and sedated Pain management: pain level controlled Vital Signs Assessment: post-procedure vital signs reviewed and stable Respiratory status: spontaneous breathing Cardiovascular status: stable Postop Assessment: no headache, no backache, spinal receding, patient able to bend at knees and no apparent nausea or vomiting Anesthetic complications: no    Last Vitals:  Vitals:   04/03/18 1730 04/03/18 1745  BP: 129/72 119/75  Pulse: (!) 59 60  Resp: 19 11  Temp: (!) 35.7 C   SpO2: 100% 100%    Last Pain:  Vitals:   04/03/18 1745  TempSrc:   PainSc: 3    Pain Goal: Patients Stated Pain Goal: 4 (04/03/18 1318)               Caren Macadam

## 2018-04-03 NOTE — Transfer of Care (Signed)
Immediate Anesthesia Transfer of Care Note  Patient: Laura Sanders  Procedure(s) Performed: RIGHT TOTAL KNEE ARTHROPLASTY (Right Knee)  Patient Location: PACU  Anesthesia Type:Spinal  Level of Consciousness: awake, alert  and oriented  Airway & Oxygen Therapy: Patient Spontanous Breathing and Patient connected to face mask oxygen  Post-op Assessment: Report given to RN and Post -op Vital signs reviewed and stable  Post vital signs: Reviewed and stable  Last Vitals:  Vitals Value Taken Time  BP 98/60 04/03/2018  4:28 PM  Temp    Pulse 66 04/03/2018  4:29 PM  Resp 11 04/03/2018  4:29 PM  SpO2 100 % 04/03/2018  4:29 PM  Vitals shown include unvalidated device data.  Last Pain:  Vitals:   04/03/18 1445  TempSrc:   PainSc: 0-No pain      Patients Stated Pain Goal: 4 (04/03/18 1318)  Complications: No apparent anesthesia complications

## 2018-04-03 NOTE — Anesthesia Preprocedure Evaluation (Addendum)
Anesthesia Evaluation  Patient identified by MRN, date of birth, ID band Patient awake    Reviewed: Allergy & Precautions, NPO status , Patient's Chart, lab work & pertinent test results  Airway Mallampati: I       Dental no notable dental hx. (+) Teeth Intact   Pulmonary    Pulmonary exam normal breath sounds clear to auscultation       Cardiovascular hypertension, Pt. on medications and Pt. on home beta blockers Normal cardiovascular exam Rhythm:Regular Rate:Normal     Neuro/Psych PSYCHIATRIC DISORDERS Anxiety Depression    GI/Hepatic Neg liver ROS,   Endo/Other  negative endocrine ROS  Renal/GU negative Renal ROS  negative genitourinary   Musculoskeletal  (+) Arthritis , Osteoarthritis,    Abdominal Normal abdominal exam  (+)   Peds  Hematology negative hematology ROS (+)   Anesthesia Other Findings   Reproductive/Obstetrics                             Anesthesia Physical Anesthesia Plan  ASA: II  Anesthesia Plan: Spinal   Post-op Pain Management:  Regional for Post-op pain   Induction:   PONV Risk Score and Plan: 2 and Ondansetron and Dexamethasone  Airway Management Planned: Natural Airway, Nasal Cannula and Simple Face Mask  Additional Equipment:   Intra-op Plan:   Post-operative Plan:   Informed Consent: I have reviewed the patients History and Physical, chart, labs and discussed the procedure including the risks, benefits and alternatives for the proposed anesthesia with the patient or authorized representative who has indicated his/her understanding and acceptance.   Dental advisory given  Plan Discussed with: CRNA and Surgeon  Anesthesia Plan Comments:         Anesthesia Quick Evaluation

## 2018-04-03 NOTE — Interval H&P Note (Signed)
History and Physical Interval Note:  04/03/2018 2:05 PM  Laura Sanders  has presented today for surgery, with the diagnosis of right knee osteoarthritis  The various methods of treatment have been discussed with the patient and family. After consideration of risks, benefits and other options for treatment, the patient has consented to  Procedure(s) with comments: RIGHT TOTAL KNEE ARTHROPLASTY (Right) - as a surgical intervention .  The patient's history has been reviewed, patient examined, no change in status, stable for surgery.  I have reviewed the patient's chart and labs.  Questions were answered to the patient's satisfaction.     Homero Fellers Euclide Granito

## 2018-04-03 NOTE — Discharge Instructions (Addendum)
° °Dr. Frank Aluisio °Total Joint Specialist °[facility] °[address] °[place], [place] [address] °[phone] ° °TOTAL KNEE REPLACEMENT POSTOPERATIVE DIRECTIONS ° °Knee Rehabilitation, Guidelines Following Surgery  °Results after knee surgery are often greatly improved when you follow the exercise, range of motion and muscle strengthening exercises prescribed by your doctor. Safety measures are also important to protect the knee from further injury. Any time any of these exercises cause you to have increased pain or swelling in your knee joint, decrease the amount until you are comfortable again and slowly increase them. If you have problems or questions, call your caregiver or physical therapist for advice.  ° °HOME CARE INSTRUCTIONS  °Remove items at home which could result in a fall. This includes throw rugs or furniture in walking pathways.  °· ICE to the affected knee every three hours for 30 minutes at a time and then as needed for pain and swelling.  Continue to use ice on the knee for pain and swelling from surgery. You may notice swelling that will progress down to the foot and ankle.  This is normal after surgery.  Elevate the leg when you are not up walking on it.   °· Continue to use the breathing machine which will help keep your temperature down.  It is common for your temperature to cycle up and down following surgery, especially at night when you are not up moving around and exerting yourself.  The breathing machine keeps your lungs expanded and your temperature down. °· Do not place pillow under knee, focus on keeping the knee straight while resting ° °DIET °You may resume your previous home diet once your are discharged from the hospital. ° °DRESSING / WOUND CARE / SHOWERING °You may shower 3 days after surgery, but keep the wounds dry during showering.  You may use an occlusive plastic wrap (Press'n Seal for example), NO SOAKING/SUBMERGING IN THE BATHTUB.  If the bandage gets  wet, change with a clean dry gauze.  If the incision gets wet, pat the wound dry with a clean towel. °You may start showering once you are discharged home but do not submerge the incision under water. Just pat the incision dry and apply a dry gauze dressing on daily. °Change the surgical dressing daily and reapply a dry dressing each time. ° °ACTIVITY °Walk with your walker as instructed. °Use walker as long as suggested by your caregivers. °Avoid periods of inactivity such as sitting longer than an hour when not asleep. This helps prevent blood clots.  °You may resume a sexual relationship in one month or when given the OK by your doctor.  °You may return to work once you are cleared by your doctor.  °Do not drive a car for 6 weeks or until released by you surgeon.  °Do not drive while taking narcotics. ° °WEIGHT BEARING °Weight bearing as tolerated with assist device (walker, cane, etc) as directed, use it as long as suggested by your surgeon or therapist, typically at least 4-6 weeks. ° °POSTOPERATIVE CONSTIPATION PROTOCOL °Constipation - defined medically as fewer than three stools per week and severe constipation as less than one stool per week. ° °One of the most common issues patients have following surgery is constipation.  Even if you have a regular bowel pattern at home, your normal regimen is likely to be disrupted due to multiple reasons following surgery.  Combination of anesthesia, postoperative narcotics, change in appetite and fluid intake all can affect your bowels.    In order to avoid complications following surgery, here are some recommendations in order to help you during your recovery period. ° °Colace (docusate) - Pick up an over-the-counter form of Colace or another stool softener and take twice a day as long as you are requiring postoperative pain medications.  Take with a full glass of water daily.  If you experience loose stools or diarrhea, hold the colace until you stool forms back up.  If  your symptoms do not get better within 1 week or if they get worse, check with your doctor. ° °Dulcolax (bisacodyl) - Pick up over-the-counter and take as directed by the product packaging as needed to assist with the movement of your bowels.  Take with a full glass of water.  Use this product as needed if not relieved by Colace only.  ° °MiraLax (polyethylene glycol) - Pick up over-the-counter to have on hand.  MiraLax is a solution that will increase the amount of water in your bowels to assist with bowel movements.  Take as directed and can mix with a glass of water, juice, soda, coffee, or tea.  Take if you go more than two days without a movement. °Do not use MiraLax more than once per day. Call your doctor if you are still constipated or irregular after using this medication for 7 days in a row. ° °If you continue to have problems with postoperative constipation, please contact the office for further assistance and recommendations.  If you experience "the worst abdominal pain ever" or develop nausea or vomiting, please contact the office immediatly for further recommendations for treatment. ° °ITCHING ° If you experience itching with your medications, try taking only a single pain pill, or even half a pain pill at a time.  You can also use Benadryl over the counter for itching or also to help with sleep.  ° °TED HOSE STOCKINGS °Wear the elastic stockings on both legs for three weeks following surgery during the day but you may remove then at night for sleeping. ° °MEDICATIONS °See your medication summary on the “After Visit Summary” that the nursing staff will review with you prior to discharge.  You may have some home medications which will be placed on hold until you complete the course of blood thinner medication.  It is important for you to complete the blood thinner medication as prescribed by your surgeon.  Continue your approved medications as instructed at time of discharge. ° °PRECAUTIONS °If you  experience chest pain or shortness of breath - call 911 immediately for transfer to the hospital emergency department.  °If you develop a fever greater that 101 F, purulent drainage from wound, increased redness or drainage from wound, foul odor from the wound/dressing, or calf pain - CONTACT YOUR SURGEON.   °                                                °FOLLOW-UP APPOINTMENTS °Make sure you keep all of your appointments after your operation with your surgeon and caregivers. You should call the office at the above phone number and make an appointment for approximately two weeks after the date of your surgery or on the date instructed by your surgeon outlined in the "After Visit Summary". ° ° °RANGE OF MOTION AND STRENGTHENING EXERCISES  °Rehabilitation of the knee is important following a knee injury or   an operation. After just a few days of immobilization, the muscles of the thigh which control the knee become weakened and shrink (atrophy). Knee exercises are designed to build up the tone and strength of the thigh muscles and to improve knee motion. Often times heat used for twenty to thirty minutes before working out will loosen up your tissues and help with improving the range of motion but do not use heat for the first two weeks following surgery. These exercises can be done on a training (exercise) mat, on the floor, on a table or on a bed. Use what ever works the best and is most comfortable for you Knee exercises include:  °Leg Lifts - While your knee is still immobilized in a splint or cast, you can do straight leg raises. Lift the leg to 60 degrees, hold for 3 sec, and slowly lower the leg. Repeat 10-20 times 2-3 times daily. Perform this exercise against resistance later as your knee gets better.  °Quad and Hamstring Sets - Tighten up the muscle on the front of the thigh (Quad) and hold for 5-10 sec. Repeat this 10-20 times hourly. Hamstring sets are done by pushing the foot backward against an object  and holding for 5-10 sec. Repeat as with quad sets.  °· Leg Slides: Lying on your back, slowly slide your foot toward your buttocks, bending your knee up off the floor (only go as far as is comfortable). Then slowly slide your foot back down until your leg is flat on the floor again. °· Angel Wings: Lying on your back spread your legs to the side as far apart as you can without causing discomfort.  °A rehabilitation program following serious knee injuries can speed recovery and prevent re-injury in the future due to weakened muscles. Contact your doctor or a physical therapist for more information on knee rehabilitation.  ° °IF YOU ARE TRANSFERRED TO A SKILLED REHAB FACILITY °If the patient is transferred to a skilled rehab facility following release from the hospital, a list of the current medications will be sent to the facility for the patient to continue.  When discharged from the skilled rehab facility, please have the facility set up the patient's Home Health Physical Therapy prior to being released. Also, the skilled facility will be responsible for providing the patient with their medications at time of release from the facility to include their pain medication, the muscle relaxants, and their blood thinner medication. If the patient is still at the rehab facility at time of the two week follow up appointment, the skilled rehab facility will also need to assist the patient in arranging follow up appointment in our office and any transportation needs. ° °MAKE SURE YOU:  °Understand these instructions.  °Get help right away if you are not doing well or get worse.  ° ° °Pick up stool softner and laxative for home use following surgery while on pain medications. °Do not submerge incision under water. °Please use good hand washing techniques while changing dressing each day. °May shower starting three days after surgery. °Please use a clean towel to pat the incision dry following showers. °Continue to use ice for  pain and swelling after surgery. °Do not use any lotions or creams on the incision until instructed by your surgeon. ° °Information on my medicine - XARELTO® (Rivaroxaban) ° ° ° °Why was Xarelto® prescribed for you? °Xarelto® was prescribed for you to reduce the risk of blood clots forming after orthopedic surgery. The medical term for   these abnormal blood clots is venous thromboembolism (VTE). ° °What do you need to know about xarelto® ? °Take your Xarelto® ONCE DAILY at the same time every day. °You may take it either with or without food. ° °If you have difficulty swallowing the tablet whole, you may crush it and mix in applesauce just prior to taking your dose. ° °Take Xarelto® exactly as prescribed by your doctor and DO NOT stop taking Xarelto® without talking to the doctor who prescribed the medication.  Stopping without other VTE prevention medication to take the place of Xarelto® may increase your risk of developing a clot. ° °After discharge, you should have regular check-up appointments with your healthcare provider that is prescribing your Xarelto®.   ° °What do you do if you miss a dose? °If you miss a dose, take it as soon as you remember on the same day then continue your regularly scheduled once daily regimen the next day. Do not take two doses of Xarelto® on the same day.  ° °Important Safety Information °A possible side effect of Xarelto® is bleeding. You should call your healthcare provider right away if you experience any of the following: °? Bleeding from an injury or your nose that does not stop. °? Unusual colored urine (red or dark brown) or unusual colored stools (red or black). °? Unusual bruising for unknown reasons. °? A serious fall or if you hit your head (even if there is no bleeding). ° °Some medicines may interact with Xarelto® and might increase your risk of bleeding while on Xarelto®. To help avoid this, consult your healthcare provider or pharmacist prior to using any new  prescription or non-prescription medications, including herbals, vitamins, non-steroidal anti-inflammatory drugs (NSAIDs) and supplements. ° °This website has more information on Xarelto®: www.xarelto.com. ° ° °

## 2018-04-04 ENCOUNTER — Encounter (HOSPITAL_COMMUNITY): Payer: Self-pay | Admitting: Orthopedic Surgery

## 2018-04-04 LAB — CBC
HEMATOCRIT: 36.2 % (ref 36.0–46.0)
HEMOGLOBIN: 12.1 g/dL (ref 12.0–15.0)
MCH: 30.3 pg (ref 26.0–34.0)
MCHC: 33.4 g/dL (ref 30.0–36.0)
MCV: 90.5 fL (ref 80.0–100.0)
Platelets: 77 10*3/uL — ABNORMAL LOW (ref 150–400)
RBC: 4 MIL/uL (ref 3.87–5.11)
RDW: 11.2 % — ABNORMAL LOW (ref 11.5–15.5)
WBC: 13.1 10*3/uL — ABNORMAL HIGH (ref 4.0–10.5)
nRBC: 0 % (ref 0.0–0.2)

## 2018-04-04 LAB — BASIC METABOLIC PANEL
Anion gap: 10 (ref 5–15)
BUN: 10 mg/dL (ref 8–23)
CHLORIDE: 97 mmol/L — AB (ref 98–111)
CO2: 28 mmol/L (ref 22–32)
Calcium: 8.4 mg/dL — ABNORMAL LOW (ref 8.9–10.3)
Creatinine, Ser: 0.7 mg/dL (ref 0.44–1.00)
GFR calc non Af Amer: >60 mL/min (ref >60)
Glucose, Bld: 151 mg/dL — ABNORMAL HIGH (ref 70–99)
POTASSIUM: 3.8 mmol/L (ref 3.5–5.1)
SODIUM: 135 mmol/L (ref 135–145)

## 2018-04-04 MED ORDER — OXYCODONE HCL 5 MG PO TABS: 5.0000 mg | ORAL_TABLET | Freq: Four times a day (QID) | ORAL | 0 refills | Status: DC | PRN

## 2018-04-04 MED ORDER — RIVAROXABAN 10 MG PO TABS: 10.0000 mg | ORAL_TABLET | Freq: Every day | ORAL | 0 refills | Status: DC

## 2018-04-04 MED ORDER — METHOCARBAMOL 500 MG PO TABS: 500.0000 mg | ORAL_TABLET | Freq: Four times a day (QID) | ORAL | 0 refills | Status: DC | PRN

## 2018-04-04 MED ORDER — BISOPROLOL-HYDROCHLOROTHIAZIDE 5-6.25 MG PO TABS
1.0000 | ORAL_TABLET | Freq: Every day | ORAL | Status: DC
  Filled 2018-04-04 (×2): qty 1

## 2018-04-04 MED ORDER — GABAPENTIN 300 MG PO CAPS: 300.0000 mg | ORAL_CAPSULE | Freq: Three times a day (TID) | ORAL | 0 refills | Status: DC

## 2018-04-04 MED ORDER — SODIUM CHLORIDE 0.9 % IV BOLUS
500.0000 mL | Freq: Once | INTRAVENOUS | Status: AC
  Administered 2018-04-04: 500 mL via INTRAVENOUS

## 2018-04-04 NOTE — Progress Notes (Signed)
   Subjective: 1 Day Post-Op Procedure(s) (LRB): RIGHT TOTAL KNEE ARTHROPLASTY (Right) Patient reports pain as mild.   Patient seen in rounds by Dr. Lequita Halt. Patient is well, and has had no acute complaints or problems other than pain in the right knee. Foley catheter removed this AM. Denies chest pain, SOB, or calf pain. No issues overnight.  We will start therapy today.   Objective: Vital signs in last 24 hours: Temp:  [96.3 F (35.7 C)-97.7 F (36.5 C)] 97.4 F (36.3 C) (10/22 0555) Pulse Rate:  [59-73] 59 (10/22 0734) Resp:  [10-19] 15 (10/22 0529) BP: (95-139)/(56-98) 98/56 (10/22 0734) SpO2:  [99 %-100 %] 99 % (10/22 0734) Weight:  [60.3 kg] 60.3 kg (10/21 1318)  Intake/Output from previous day:  Intake/Output Summary (Last 24 hours) at 04/04/2018 0741 Last data filed at 04/04/2018 0600 Gross per 24 hour  Intake 3849.54 ml  Output 3150 ml  Net 699.54 ml    Labs: Recent Labs    04/03/18 1313 04/04/18 0451  HGB 12.3 12.1   Recent Labs    04/03/18 1313 04/04/18 0451  WBC 7.9 13.1*  RBC 4.07 4.00  HCT 37.4 36.2  PLT 78* 77*   Recent Labs    04/04/18 0451  NA 135  K 3.8  CL 97*  CO2 28  BUN 10  CREATININE 0.70  GLUCOSE 151*  CALCIUM 8.4*   Exam: General - Patient is Alert and Oriented Extremity - Neurologically intact Neurovascular intact Sensation intact distally Dorsiflexion/Plantar flexion intact Dressing - dressing C/D/I Motor Function - intact, moving foot and toes well on exam.   Past Medical History:  Diagnosis Date  . Allergy   . Anxiety   . Arthritis    hands, knee  . Complication of anesthesia    REAL BAD NAUSEA   . Depression   . Hypertension   . Insomnia   . Migraines    "RESOLVED"   . Thrombocytopenia (HCC)     Assessment/Plan: 1 Day Post-Op Procedure(s) (LRB): RIGHT TOTAL KNEE ARTHROPLASTY (Right) Principal Problem:   OA (osteoarthritis) of knee  Estimated body mass index is 22.48 kg/m as calculated from the  following:   Height as of this encounter: 5' 4.5" (1.638 m).   Weight as of this encounter: 60.3 kg. Advance diet Up with therapy  Anticipated LOS equal to or greater than 2 midnights due to - Age 16 and older with one or more of the following:  - Obesity  - Expected need for hospital services (PT, OT, Nursing) required for safe  discharge  - Anticipated need for postoperative skilled nursing care or inpatient rehab  - Active co-morbidities: Thrombocytopenia OR   - Unanticipated findings during/Post Surgery: None  - Patient is a high risk of re-admission due to: None    DVT Prophylaxis - Xarelto Weight bearing as tolerated. D/C O2 and pulse ox and try on room air. Hemovac pulled without difficulty, will begin therapy today.  BP soft this AM, 500 mL bolus ordered.  Plan is to go Home after hospital stay. Possible discharge tomorrow if progresses with therapy and is meeting her goals.   Arther Abbott, PA-C Orthopedic Surgery 04/04/2018, 7:41 AM

## 2018-04-04 NOTE — Evaluation (Signed)
Physical Therapy Evaluation Patient Details Name: Laura Sanders MRN: [id number] DOB: [birth year] Today's Date: 04/04/2018   History of Present Illness  Patient is a 67 y/o female admitted s/p R TKA.  Clinical Impression  Patient presents with decreased mobility due to weakness, decreased ROM R knee and will benefit from skilled PT In the acute setting to allow d/c home with family support and follow up PT per MD.    Follow Up Recommendations Follow surgeon's recommendation for DC plan and follow-up therapies    Equipment Recommendations  Rolling walker with 5" wheels;3in1 (PT)    Recommendations for Other Services       Precautions / Restrictions Precautions Precautions: Fall;Knee Required Braces or Orthoses: Knee Immobilizer - Right Restrictions Other Position/Activity Restrictions: WBAT      Mobility  Bed Mobility Overal bed mobility: Needs Assistance Bed Mobility: Supine to Sit     Supine to sit: Min assist     General bed mobility comments: for R LE  Transfers Overall transfer level: Needs assistance Equipment used: Rolling walker (2 wheeled) Transfers: Sit to/from Stand Sit to Stand: Supervision         General transfer comment: cues for hand placement  Ambulation/Gait Ambulation/Gait assistance: Min guard;Supervision Gait Distance (Feet): 120 Feet Assistive device: Rolling walker (2 wheeled) Gait Pattern/deviations: Step-to pattern;Step-through pattern;Antalgic;Decreased stride length     General Gait Details: cues for sequence and assist for safety  Stairs            Wheelchair Mobility    Modified Rankin (Stroke Patients Only)       Balance Overall balance assessment: No apparent balance deficits (not formally assessed)                                           Pertinent Vitals/Pain Pain Assessment: 0-10 Pain Score: 5  Pain Location: R knee Pain Descriptors / Indicators: Aching Pain Intervention(s): Monitored  during session    Home Living Family/patient expects to be discharged to:: Private residence Living Arrangements: Spouse/significant other Available Help at Discharge: Family Type of Home: House Home Access: Stairs to enter Entrance Stairs-Rails: Doctor, general practice of Steps: 3 Home Layout: One level        Prior Function Level of Independence: Independent         Comments: works Editor, 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; esthetician     Hand Dominance   Dominant Hand: Right    Extremity/Trunk Assessment   Upper Extremity Assessment Upper Extremity Assessment: Overall WFL for tasks assessed    Lower Extremity Assessment Lower Extremity Assessment: RLE deficits/detail RLE Deficits / Details: AAROM about 40 degrees, strength positive SLR        Communication   Communication: No difficulties  Cognition Arousal/Alertness: Awake/alert Behavior During Therapy: WFL for tasks assessed/performed Overall Cognitive Status: Within Functional Limits for tasks assessed                                        General Comments      Exercises Total Joint Exercises Ankle Circles/Pumps: AROM;10 reps;Both;Supine Quad Sets: AROM;10 reps;Right;Supine Short Arc Quad: AROM;10 reps;Right;Supine Heel Slides: AAROM;10 reps;Supine;Right Hip ABduction/ADduction: AROM;10 reps;Right;Supine Straight Leg Raises: AROM;10 reps;Right;Supine Goniometric ROM: approx 10-40 AAROM   Assessment/Plan    PT Assessment Patient  needs continued PT services  PT Problem List Decreased strength;Decreased mobility;Decreased safety awareness;Decreased range of motion;Decreased activity tolerance;Decreased knowledge of use of DME;Pain       PT Treatment Interventions DME instruction;Therapeutic activities;Patient/family education;Therapeutic exercise;Gait training;Stair training;Functional mobility training    PT Goals (Current goals can be found in the Care  Plan section)  Acute Rehab PT Goals Patient Stated Goal: to return home PT Goal Formulation: With patient/family Time For Goal Achievement: 04/07/18 Potential to Achieve Goals: Good    Frequency 7X/week   Barriers to discharge        Co-evaluation               AM-PAC PT "6 Clicks" Daily Activity  Outcome Measure Difficulty turning over in bed (including adjusting bedclothes, sheets and blankets)?: A Little Difficulty moving from lying on back to sitting on the side of the bed? : Unable Difficulty sitting down on and standing up from a chair with arms (e.g., wheelchair, bedside commode, etc,.)?: Unable Help needed moving to and from a bed to chair (including a wheelchair)?: A Little Help needed walking in hospital room?: A Little Help needed climbing 3-5 steps with a railing? : A Lot 6 Click Score: 13    End of Session Equipment Utilized During Treatment: Gait belt;Right knee immobilizer Activity Tolerance: Patient tolerated treatment well Patient left: with call bell/phone within reach;with family/visitor present;in chair   PT Visit Diagnosis: Difficulty in walking, not elsewhere classified (R26.2);Pain Pain - Right/Left: Right Pain - part of body: Knee    Time: 1134-1200 PT Time Calculation (min) (ACUTE ONLY): 26 min   Charges:   PT Evaluation $PT Eval Low Complexity: 1 Low PT Treatments $Gait Training: 8-22 mins        Sheran Lawless, [facility] [phone] 04/04/2018]   Elray Mcgregor 04/04/2018, 1:08 PM

## 2018-04-04 NOTE — Progress Notes (Signed)
Physical Therapy Treatment Patient Details Name: Laura Sanders MRN: [id number] DOB: [birth year] Today's Date: 04/04/2018    History of Present Illness Patient is a 67 y/o female admitted s/p R TKA.    PT Comments    Pt presents with decreased amb and increased pain due to R TKA. Pt was able to amb down the hall with supervision and min guard for safety due to decreased stability. Pt was able to climb a set 4 stairs with bilateral UE support with minimal cueing for proper lead leg placement. Pt will benefit from skilled PT while in the acute care setting until d/c.   Follow Up Recommendations  Follow surgeon's recommendation for DC plan and follow-up therapies     Equipment Recommendations  Rolling walker with 5" wheels;3in1 (PT)    Recommendations for Other Services       Precautions / Restrictions Precautions Precautions: Fall;Knee Required Braces or Orthoses: Knee Immobilizer - Right Restrictions Other Position/Activity Restrictions: WBAT    Mobility  Bed Mobility Overal bed mobility: Needs Assistance Bed Mobility: Supine to Sit     Supine to sit: Min assist     General bed mobility comments: for R LE support  Transfers Overall transfer level: Needs assistance Equipment used: Rolling walker (2 wheeled) Transfers: Sit to/from Stand Sit to Stand: Supervision         General transfer comment: Pt required cues for handplacement  Ambulation/Gait Ambulation/Gait assistance: Min guard;Supervision Gait Distance (Feet): 250 Feet Assistive device: Rolling walker (2 wheeled) Gait Pattern/deviations: Step-to pattern;Decreased stride length;Decreased weight shift to right;Decreased dorsiflexion - right;Wide base of support;Step-through pattern     General Gait Details: Pt required cues for staying within the bounds of the walker. Pt required min guard for saftey due to decreased stability.   Stairs Stairs: Yes Stairs assistance: Supervision Stair Management: Two  rails;Forwards;Step to pattern Number of Stairs: 4 General stair comments: Pt amb up and down stairs and required verbal cueing for proper foot placement.   Wheelchair Mobility    Modified Rankin (Stroke Patients Only)       Balance Overall balance assessment: No apparent balance deficits (not formally assessed)                                          Cognition Arousal/Alertness: Awake/alert Behavior During Therapy: WFL for tasks assessed/performed Overall Cognitive Status: Within Functional Limits for tasks assessed                                        Exercises Total Joint Exercises Ankle Circles/Pumps: AROM;10 reps;Both;Supine Quad Sets: AROM;10 reps;Right;Supine Short Arc Quad: AROM;10 reps;Right;Supine Heel Slides: 10 reps;Supine;Right;AROM Straight Leg Raises: AROM;10 reps;Right;Supine Goniometric ROM: R knee Flexion 60 degrees    General Comments        Pertinent Vitals/Pain Pain Assessment: 0-10 Pain Score: 2  Pain Location: R knee at rest 2/10 when walking 5/10. Pt reports pain behind the knee. Pain Descriptors / Indicators: Aching Pain Intervention(s): Monitored during session;Ice applied    Home Living                      Prior Function            PT Goals (current goals can now be found in the care plan  section) Progress towards PT goals: Progressing toward goals    Frequency    7X/week      PT Plan Current plan remains appropriate    Co-evaluation              AM-PAC PT "6 Clicks" Daily Activity  Outcome Measure  Difficulty turning over in bed (including adjusting bedclothes, sheets and blankets)?: A Little Difficulty moving from lying on back to sitting on the side of the bed? : A Lot Difficulty sitting down on and standing up from a chair with arms (e.g., wheelchair, bedside commode, etc,.)?: A Lot Help needed moving to and from a bed to chair (including a wheelchair)?: A  Little Help needed walking in hospital room?: A Little Help needed climbing 3-5 steps with a railing? : A Little 6 Click Score: 16    End of Session Equipment Utilized During Treatment: Gait belt Activity Tolerance: Patient tolerated treatment well Patient left: with call bell/phone within reach;in bed   PT Visit Diagnosis: Difficulty in walking, not elsewhere classified (R26.2);Pain Pain - Right/Left: Right Pain - part of body: Knee     Time: 1610-9604 PT Time Calculation (min) (ACUTE ONLY): 22 min  Charges:  $Gait Training: 8-22 mins                     [employer] SPT 04/04/2018    Sanjuana Letters 04/04/2018, 5:33 PM

## 2018-04-05 LAB — BASIC METABOLIC PANEL
ANION GAP: 7 (ref 5–15)
BUN: 11 mg/dL (ref 8–23)
CALCIUM: 8.9 mg/dL (ref 8.9–10.3)
CO2: 32 mmol/L (ref 22–32)
Chloride: 97 mmol/L — ABNORMAL LOW (ref 98–111)
Creatinine, Ser: 0.66 mg/dL (ref 0.44–1.00)
GFR calc Af Amer: >60 mL/min (ref >60)
GLUCOSE: 104 mg/dL — AB (ref 70–99)
POTASSIUM: 4.1 mmol/L (ref 3.5–5.1)
SODIUM: 136 mmol/L (ref 135–145)

## 2018-04-05 LAB — CBC
HCT: 31.4 % — ABNORMAL LOW (ref 36.0–46.0)
Hemoglobin: 10.1 g/dL — ABNORMAL LOW (ref 12.0–15.0)
MCH: 29.8 pg (ref 26.0–34.0)
MCHC: 32.2 g/dL (ref 30.0–36.0)
MCV: 92.6 fL (ref 80.0–100.0)
NRBC: 0 % (ref 0.0–0.2)
PLATELETS: 109 10*3/uL — AB (ref 150–400)
RBC: 3.39 MIL/uL — ABNORMAL LOW (ref 3.87–5.11)
RDW: 11.6 % (ref 11.5–15.5)
WBC: 17.7 10*3/uL — ABNORMAL HIGH (ref 4.0–10.5)

## 2018-04-05 MED ORDER — SODIUM CHLORIDE 0.9 % IV BOLUS
250.0000 mL | Freq: Once | INTRAVENOUS | Status: AC
  Administered 2018-04-05: 250 mL via INTRAVENOUS

## 2018-04-05 NOTE — Care Management (Signed)
Pt expressed to this CM that she needs a RW. RN to place order and call [facility] DME rep. Sandford Craze RN [phone]

## 2018-04-05 NOTE — Progress Notes (Signed)
   Subjective: 2 Days Post-Op Procedure(s) (LRB): RIGHT TOTAL KNEE ARTHROPLASTY (Right) Patient reports pain as mild.   Patient seen in rounds for Dr. Lequita Halt. Patient is well, and has had no acute complaints or problems. States she is ready to go home, voiding without difficulty. Positive flatus. Denies CP, SOB, or calf pain. Plan is to go Home after hospital stay.  Objective: Vital signs in last 24 hours: Temp:  [97.6 F (36.4 C)-98.3 F (36.8 C)] 98.3 F (36.8 C) (10/23 0550) Pulse Rate:  [60-73] 69 (10/23 0550) Resp:  [16-18] 16 (10/23 0550) BP: (105-137)/(54-77) 137/68 (10/23 0550) SpO2:  [97 %-100 %] 100 % (10/23 0550)  Intake/Output from previous day:  Intake/Output Summary (Last 24 hours) at 04/05/2018 0755 Last data filed at 04/05/2018 0200 Gross per 24 hour  Intake 2369.65 ml  Output 500 ml  Net 1869.65 ml    Intake/Output this shift: No intake/output data recorded.  Labs: Recent Labs    04/03/18 1313 04/04/18 0451 04/05/18 0514  HGB 12.3 12.1 10.1*   Recent Labs    04/04/18 0451 04/05/18 0514  WBC 13.1* 17.7*  RBC 4.00 3.39*  HCT 36.2 31.4*  PLT 77* 109*   Recent Labs    04/04/18 0451 04/05/18 0514  NA 135 136  K 3.8 4.1  CL 97* 97*  CO2 28 32  BUN 10 11  CREATININE 0.70 0.66  GLUCOSE 151* 104*  CALCIUM 8.4* 8.9   No results for input(s): LABPT, INR in the last 72 hours.  Exam: General - Patient is Alert and Oriented Extremity - Neurologically intact Neurovascular intact Sensation intact distally Dorsiflexion/Plantar flexion intact Dressing/Incision - clean, dry, no drainage Motor Function - intact, moving foot and toes well on exam.   Past Medical History:  Diagnosis Date  . Allergy   . Anxiety   . Arthritis    hands, knee  . Complication of anesthesia    REAL BAD NAUSEA   . Depression   . Hypertension   . Insomnia   . Migraines    "RESOLVED"   . Thrombocytopenia (HCC)     Assessment/Plan: 2 Days Post-Op  Procedure(s) (LRB): RIGHT TOTAL KNEE ARTHROPLASTY (Right) Principal Problem:   OA (osteoarthritis) of knee  Estimated body mass index is 22.48 kg/m as calculated from the following:   Height as of this encounter: 5' 4.5" (1.638 m).   Weight as of this encounter: 60.3 kg. Up with therapy D/C IV fluids  DVT Prophylaxis - Xarelto Weight-bearing as tolerated  Plan for discharge to home after one session of therapy. Scheduled for OP PT at [facility] in [place]. Follow-up in the office in 2 weeks with Dr. Lequita Halt.  Arther Abbott, PA-C Orthopedic Surgery 04/05/2018, 7:55 AM

## 2018-04-05 NOTE — Progress Notes (Addendum)
Pt became dizzy/lightheaded with PT during first am session.  BP dropped to 85/54 and pt was only able to to perform exercises. Pt's bp recovered to 112/64 after resting. When therapy returned to ambulate pt, she became dizzy when ambulating to bathroom and bp dropped again to 86/58. PA Danford Bad made aware.   bolus ordered. Bp went up to 132/75.  RN will monitor.

## 2018-04-05 NOTE — Progress Notes (Signed)
Physical Therapy Treatment Patient Details Name: ROMELLE MULDOON MRN: [id number] DOB: 08-Apr-1951 Today's Date: 04/05/2018    History of Present Illness Patient is a 67 y/o female admitted s/p R TKA.    PT Comments    Pt limited this am by increased pain (despite premed) and c/o dizziness with move to sitting - BP 85/54 - RN aware.   Follow Up Recommendations  Follow surgeon's recommendation for DC plan and follow-up therapies     Equipment Recommendations  Rolling walker with 5" wheels;3in1 (PT)    Recommendations for Other Services       Precautions / Restrictions Precautions Precautions: Fall;Knee Required Braces or Orthoses: Knee Immobilizer - Right Knee Immobilizer - Right: Discontinue once straight leg raise with < 10 degree lag Restrictions Weight Bearing Restrictions: No Other Position/Activity Restrictions: WBAT    Mobility  Bed Mobility Overal bed mobility: Needs Assistance Bed Mobility: Supine to Sit     Supine to sit: Min assist     General bed mobility comments: for R LE support; Increased time  Transfers                 General transfer comment: NT - pt dizzy with sitting EOB - BP 85/54  Ambulation/Gait                 Stairs             Wheelchair Mobility    Modified Rankin (Stroke Patients Only)       Balance                                            Cognition Arousal/Alertness: Awake/alert Behavior During Therapy: WFL for tasks assessed/performed Overall Cognitive Status: Within Functional Limits for tasks assessed                                        Exercises      General Comments        Pertinent Vitals/Pain Pain Assessment: 0-10 Pain Score: 8  Pain Location: R knee with movement Pain Descriptors / Indicators: Aching;Sore Pain Intervention(s): Limited activity within patient's tolerance;Monitored during session;Premedicated before session;Ice applied    Home  Living                      Prior Function            PT Goals (current goals can now be found in the care plan section) Acute Rehab PT Goals Patient Stated Goal: to return home PT Goal Formulation: With patient/family Time For Goal Achievement: 04/07/18 Potential to Achieve Goals: Good Progress towards PT goals: Progressing toward goals    Frequency    7X/week      PT Plan Current plan remains appropriate    Co-evaluation              AM-PAC PT "6 Clicks" Daily Activity  Outcome Measure  Difficulty turning over in bed (including adjusting bedclothes, sheets and blankets)?: Unable Difficulty moving from lying on back to sitting on the side of the bed? : Unable Difficulty sitting down on and standing up from a chair with arms (e.g., wheelchair, bedside commode, etc,.)?: Unable Help needed moving to and from a bed to chair (including a wheelchair)?: A Little  Help needed walking in hospital room?: A Lot Help needed climbing 3-5 steps with a railing? : A Lot 6 Click Score: 10    End of Session   Activity Tolerance: Patient limited by fatigue;Other (comment) Patient left: with call bell/phone within reach;in bed Nurse Communication: Mobility status PT Visit Diagnosis: Difficulty in walking, not elsewhere classified (R26.2);Pain Pain - Right/Left: Right Pain - part of body: Knee     Time: 1610-9604 PT Time Calculation (min) (ACUTE ONLY): 21 min  Charges:  $Therapeutic Activity: 8-22 mins                     Mauro Kaufmann PT Acute Rehabilitation Services Pager [phone] Office [phone]    Lyzbeth Genrich 04/05/2018, 11:08 AM

## 2018-04-05 NOTE — Progress Notes (Signed)
Physical Therapy Treatment Patient Details Name: Laura Sanders MRN: [id number] DOB: August 30, 1950 Today's Date: 04/05/2018    History of Present Illness Patient is a 67 y/o female admitted s/p R TKA.    PT Comments    Pt continues to c/o increased pain and tolerating ltd WB on R LE.  Pt with c/o dizziness after trip to/from bathroom - BP 66/58 - RN aware.   Follow Up Recommendations  Follow surgeon's recommendation for DC plan and follow-up therapies     Equipment Recommendations  Rolling walker with 5" wheels;3in1 (PT)    Recommendations for Other Services       Precautions / Restrictions Precautions Precautions: Fall;Knee Required Braces or Orthoses: Knee Immobilizer - Right Knee Immobilizer - Right: Discontinue once straight leg raise with < 10 degree lag Restrictions Weight Bearing Restrictions: No Other Position/Activity Restrictions: WBAT    Mobility  Bed Mobility Overal bed mobility: Needs Assistance Bed Mobility: Supine to Sit;Sit to Supine     Supine to sit: Min assist Sit to supine: Min assist   General bed mobility comments: for R LE support; Increased time  Transfers Overall transfer level: Needs assistance Equipment used: Rolling walker (2 wheeled) Transfers: Sit to/from Stand Sit to Stand: Min guard         General transfer comment: cues for use of UEs to self assist and for LE management.    Ambulation/Gait Ambulation/Gait assistance: Min guard Gait Distance (Feet): 18 Feet(twice - to/from bathroom) Assistive device: Rolling walker (2 wheeled) Gait Pattern/deviations: Step-to pattern;Decreased step length - right;Decreased step length - left;Shuffle;Trunk flexed Gait velocity: dec   General Gait Details: cues for sequence, posture and position from RW;  Pt tolerating min WB on R LE.    Stairs             Wheelchair Mobility    Modified Rankin (Stroke Patients Only)       Balance Overall balance assessment: No apparent balance  deficits (not formally assessed)                                          Cognition Arousal/Alertness: Awake/alert Behavior During Therapy: WFL for tasks assessed/performed Overall Cognitive Status: Within Functional Limits for tasks assessed                                        Exercises      General Comments        Pertinent Vitals/Pain Pain Assessment: 0-10 Pain Score: 7  Pain Location: R knee with movement Pain Descriptors / Indicators: Aching;Sore Pain Intervention(s): Limited activity within patient's tolerance;Monitored during session;Premedicated before session;Ice applied    Home Living                      Prior Function            PT Goals (current goals can now be found in the care plan section) Acute Rehab PT Goals Patient Stated Goal: to return home PT Goal Formulation: With patient/family Time For Goal Achievement: 04/07/18 Potential to Achieve Goals: Good Progress towards PT goals: Progressing toward goals    Frequency    7X/week      PT Plan Current plan remains appropriate    Co-evaluation  AM-PAC PT "6 Clicks" Daily Activity  Outcome Measure  Difficulty turning over in bed (including adjusting bedclothes, sheets and blankets)?: Unable Difficulty moving from lying on back to sitting on the side of the bed? : Unable Difficulty sitting down on and standing up from a chair with arms (e.g., wheelchair, bedside commode, etc,.)?: Unable Help needed moving to and from a bed to chair (including a wheelchair)?: A Little Help needed walking in hospital room?: A Little Help needed climbing 3-5 steps with a railing? : A Lot 6 Click Score: 11    End of Session Equipment Utilized During Treatment: Gait belt Activity Tolerance: Patient limited by fatigue;Other (comment) Patient left: with call bell/phone within reach;in bed Nurse Communication: Mobility status PT Visit Diagnosis:  Difficulty in walking, not elsewhere classified (R26.2);Pain Pain - Right/Left: Right Pain - part of body: Knee     Time: 1610-9604 PT Time Calculation (min) (ACUTE ONLY): 21 min  Charges:  $Gait Training: 8-22 mins $Therapeutic Activity: 8-22 mins                     Mauro Kaufmann PT Acute Rehabilitation Services Pager [phone] Office [phone]    Woodard Perrell 04/05/2018, 12:26 PM

## 2018-04-06 LAB — CBC
HCT: 29.8 % — ABNORMAL LOW (ref 36.0–46.0)
Hemoglobin: 9.8 g/dL — ABNORMAL LOW (ref 12.0–15.0)
MCH: 30.2 pg (ref 26.0–34.0)
MCHC: 32.9 g/dL (ref 30.0–36.0)
MCV: 91.7 fL (ref 80.0–100.0)
PLATELETS: 114 10*3/uL — AB (ref 150–400)
RBC: 3.25 MIL/uL — ABNORMAL LOW (ref 3.87–5.11)
RDW: 11.7 % (ref 11.5–15.5)
WBC: 11.3 10*3/uL — ABNORMAL HIGH (ref 4.0–10.5)
nRBC: 0 % (ref 0.0–0.2)

## 2018-04-06 MED ORDER — METHOCARBAMOL 500 MG PO TABS: 500.0000 mg | ORAL_TABLET | Freq: Four times a day (QID) | ORAL | 0 refills | Status: DC | PRN

## 2018-04-06 MED ORDER — RIVAROXABAN 10 MG PO TABS: 10.0000 mg | ORAL_TABLET | Freq: Every day | ORAL | 0 refills | Status: DC

## 2018-04-06 MED ORDER — GABAPENTIN 300 MG PO CAPS: 300.0000 mg | ORAL_CAPSULE | Freq: Three times a day (TID) | ORAL | 0 refills | Status: DC

## 2018-04-06 MED ORDER — OXYCODONE HCL 5 MG PO TABS: 5.0000 mg | ORAL_TABLET | Freq: Four times a day (QID) | ORAL | 0 refills | Status: DC | PRN

## 2018-04-06 NOTE — Progress Notes (Signed)
Physical Therapy Treatment Patient Details Name: Laura Sanders MRN: [id number] DOB: 1950/07/18 Today's Date: 04/06/2018    History of Present Illness Patient is a 67 y/o female admitted s/p R TKA.    PT Comments    Pt reports improved pain control this am - able to tolerate limited therex program and ambulating short distance in hallway.  Pt c/o mild dizziness with ambulation - BP 117/70 - RN aware.   Follow Up Recommendations  Follow surgeon's recommendation for DC plan and follow-up therapies     Equipment Recommendations  Rolling walker with 5" wheels;3in1 (PT)    Recommendations for Other Services       Precautions / Restrictions Precautions Precautions: Fall;Knee Required Braces or Orthoses: Knee Immobilizer - Right Knee Immobilizer - Right: Discontinue once straight leg raise with < 10 degree lag Restrictions Weight Bearing Restrictions: No Other Position/Activity Restrictions: WBAT    Mobility  Bed Mobility Overal bed mobility: Needs Assistance Bed Mobility: Supine to Sit     Supine to sit: Min assist     General bed mobility comments: for R LE support; Increased time  Transfers Overall transfer level: Needs assistance Equipment used: Rolling walker (2 wheeled) Transfers: Sit to/from Stand Sit to Stand: Min guard         General transfer comment: cues for use of UEs to self assist and for LE management.    Ambulation/Gait Ambulation/Gait assistance: Min assist;Min guard Gait Distance (Feet): 58 Feet Assistive device: Rolling walker (2 wheeled) Gait Pattern/deviations: Step-to pattern;Decreased step length - right;Decreased step length - left;Decreased stance time - right;Shuffle;Trunk flexed Gait velocity: dec   General Gait Details: cues for sequence, posture and position from RW;  Pt tolerating ltd WB on R LE.    Stairs             Wheelchair Mobility    Modified Rankin (Stroke Patients Only)       Balance Overall balance  assessment: No apparent balance deficits (not formally assessed)                                          Cognition Arousal/Alertness: Awake/alert Behavior During Therapy: WFL for tasks assessed/performed Overall Cognitive Status: Within Functional Limits for tasks assessed                                        Exercises Total Joint Exercises Ankle Circles/Pumps: AROM;Both;Supine;20 reps Quad Sets: AROM;10 reps;Right;Supine Heel Slides: Supine;Right;AROM;15 reps Straight Leg Raises: 10 reps;Right;Supine;AAROM Knee Flexion: AAROM Goniometric ROM: R knee AAROM -8 - 40 - pain ltd with muscle guarding    General Comments        Pertinent Vitals/Pain Pain Assessment: 0-10 Pain Score: 4  Pain Location: R knee Pain Descriptors / Indicators: Aching;Sore Pain Intervention(s): Limited activity within patient's tolerance;Monitored during session;Premedicated before session;Ice applied    Home Living                      Prior Function            PT Goals (current goals can now be found in the care plan section) Acute Rehab PT Goals Patient Stated Goal: to return home PT Goal Formulation: With patient/family Time For Goal Achievement: 04/07/18 Potential to Achieve Goals: Good Progress towards  PT goals: Progressing toward goals    Frequency    7X/week      PT Plan Current plan remains appropriate    Co-evaluation              AM-PAC PT "6 Clicks" Daily Activity  Outcome Measure  Difficulty turning over in bed (including adjusting bedclothes, sheets and blankets)?: Unable Difficulty moving from lying on back to sitting on the side of the bed? : Unable Difficulty sitting down on and standing up from a chair with arms (e.g., wheelchair, bedside commode, etc,.)?: Unable Help needed moving to and from a bed to chair (including a wheelchair)?: A Little Help needed walking in hospital room?: A Little Help needed climbing  3-5 steps with a railing? : A Lot 6 Click Score: 11    End of Session Equipment Utilized During Treatment: Gait belt Activity Tolerance: Patient tolerated treatment well;Patient limited by fatigue;Patient limited by pain Patient left: in chair;with call bell/phone within reach Nurse Communication: Mobility status PT Visit Diagnosis: Difficulty in walking, not elsewhere classified (R26.2);Pain Pain - Right/Left: Right Pain - part of body: Knee     Time: 4098-1191 PT Time Calculation (min) (ACUTE ONLY): 27 min  Charges:  $Gait Training: 8-22 mins $Therapeutic Exercise: 8-22 mins                     Mauro Kaufmann PT Acute Rehabilitation Services Pager [phone] Office [phone]    Skylor Hughson 04/06/2018, 9:56 AM

## 2018-04-06 NOTE — Progress Notes (Signed)
Physical Therapy Treatment Patient Details Name: Laura Sanders MRN: [id number] DOB: Oct 02, 1950 Today's Date: 04/06/2018    History of Present Illness Patient is a 67 y/o female admitted s/p R TKA.    PT Comments    Pt cooperative and progressing with mobility now with improved pain control.  Reviewed stairs and home therex program.   Follow Up Recommendations  Follow surgeon's recommendation for DC plan and follow-up therapies     Equipment Recommendations  Rolling walker with 5" wheels;3in1 (PT)    Recommendations for Other Services       Precautions / Restrictions Precautions Precautions: Fall;Knee Required Braces or Orthoses: Knee Immobilizer - Right Knee Immobilizer - Right: Discontinue once straight leg raise with < 10 degree lag Restrictions Weight Bearing Restrictions: No Other Position/Activity Restrictions: WBAT    Mobility  Bed Mobility Overal bed mobility: Needs Assistance Bed Mobility: Supine to Sit;Sit to Supine     Supine to sit: Min assist Sit to supine: Min assist   General bed mobility comments: for R LE support; Increased time  Transfers Overall transfer level: Needs assistance Equipment used: Rolling walker (2 wheeled) Transfers: Sit to/from Stand Sit to Stand: Min guard         General transfer comment: cues for use of UEs to self assist and for LE management.    Ambulation/Gait Ambulation/Gait assistance: Min Emergency planning/management officer (Feet): 50 Feet Assistive device: Rolling walker (2 wheeled) Gait Pattern/deviations: Step-to pattern;Decreased step length - right;Decreased step length - left;Decreased stance time - right;Shuffle;Trunk flexed Gait velocity: dec   General Gait Details: cues for sequence, posture and position from RW;  Pt tolerating increased  WB on R LE.    Stairs Stairs: Yes Stairs assistance: Min assist Stair Management: Two rails;Forwards;Step to pattern;No rails;Backwards;With walker Number of Stairs:  5 General stair comments: 5 stairs with bilat rails and single step twice bkwd with RW; cues for sequence and foot placement   Wheelchair Mobility    Modified Rankin (Stroke Patients Only)       Balance Overall balance assessment: No apparent balance deficits (not formally assessed)                                          Cognition Arousal/Alertness: Awake/alert Behavior During Therapy: WFL for tasks assessed/performed Overall Cognitive Status: Within Functional Limits for tasks assessed                                        Exercises      General Comments        Pertinent Vitals/Pain Pain Assessment: 0-10 Pain Score: 4  Pain Location: R knee Pain Descriptors / Indicators: Aching;Sore Pain Intervention(s): Monitored during session;Premedicated before session;Limited activity within patient's tolerance;Ice applied    Home Living                      Prior Function            PT Goals (current goals can now be found in the care plan section) Acute Rehab PT Goals Patient Stated Goal: to return home PT Goal Formulation: With patient/family Time For Goal Achievement: 04/07/18 Potential to Achieve Goals: Good Progress towards PT goals: Progressing toward goals    Frequency    7X/week  PT Plan Current plan remains appropriate    Co-evaluation              AM-PAC PT "6 Clicks" Daily Activity  Outcome Measure  Difficulty turning over in bed (including adjusting bedclothes, sheets and blankets)?: Unable Difficulty moving from lying on back to sitting on the side of the bed? : Unable Difficulty sitting down on and standing up from a chair with arms (e.g., wheelchair, bedside commode, etc,.)?: Unable Help needed moving to and from a bed to chair (including a wheelchair)?: A Little Help needed walking in hospital room?: A Little Help needed climbing 3-5 steps with a railing? : A Little 6 Click Score:  12    End of Session Equipment Utilized During Treatment: Gait belt;Right knee immobilizer Activity Tolerance: Patient tolerated treatment well;Patient limited by fatigue Patient left: in bed;with call bell/phone within reach;with family/visitor present Nurse Communication: Mobility status PT Visit Diagnosis: Difficulty in walking, not elsewhere classified (R26.2);Pain Pain - Right/Left: Right Pain - part of body: Knee     Time: 1213-1250 PT Time Calculation (min) (ACUTE ONLY): 37 min  Charges:  $Gait Training: 8-22 mins $Therapeutic Activity: 8-22 mins                     Mauro Kaufmann PT Acute Rehabilitation Services Pager [phone] Office [phone]    [facility] 04/06/2018, 4:38 PM

## 2018-04-06 NOTE — Progress Notes (Signed)
   Subjective: 3 Days Post-Op Procedure(s) (LRB): RIGHT TOTAL KNEE ARTHROPLASTY (Right) Patient reports pain as mild.   Patient seen in rounds with Dr. Lequita Halt. Patient is well, and has had no acute complaints or problems. States pain is much more controlled this AM and she is ready to go home. Denies chest pain, SOB, or calf pain. Voiding without difficulty and positive flatus. No further issues with lightheadedness/dizziness. Plan is to go Home after hospital stay.  Objective: Vital signs in last 24 hours: Temp:  [97.5 F (36.4 C)-98.7 F (37.1 C)] 97.8 F (36.6 C) (10/24 0646) Pulse Rate:  [68-83] 83 (10/24 0646) Resp:  [16-17] 17 (10/24 0646) BP: (112-134)/(64-91) 134/74 (10/24 0646) SpO2:  [93 %-100 %] 99 % (10/24 0646)  Intake/Output from previous day:  Intake/Output Summary (Last 24 hours) at 04/06/2018 0808 Last data filed at 04/06/2018 0600 Gross per 24 hour  Intake 970 ml  Output 2100 ml  Net -1130 ml    Labs: Recent Labs    04/03/18 1313 04/04/18 0451 04/05/18 0514 04/06/18 0458  HGB 12.3 12.1 10.1* 9.8*   Recent Labs    04/05/18 0514 04/06/18 0458  WBC 17.7* 11.3*  RBC 3.39* 3.25*  HCT 31.4* 29.8*  PLT 109* 114*   Recent Labs    04/04/18 0451 04/05/18 0514  NA 135 136  K 3.8 4.1  CL 97* 97*  CO2 28 32  BUN 10 11  CREATININE 0.70 0.66  GLUCOSE 151* 104*  CALCIUM 8.4* 8.9   Exam: General - Patient is Alert and Oriented Extremity - Neurologically intact Neurovascular intact Sensation intact distally Dorsiflexion/Plantar flexion intact Dressing/Incision - clean, dry, no drainage Motor Function - intact, moving foot and toes well on exam.   Past Medical History:  Diagnosis Date  . Allergy   . Anxiety   . Arthritis    hands, knee  . Complication of anesthesia    REAL BAD NAUSEA   . Depression   . Hypertension   . Insomnia   . Migraines    "RESOLVED"   . Thrombocytopenia (HCC)     Assessment/Plan: 3 Days Post-Op Procedure(s)  (LRB): RIGHT TOTAL KNEE ARTHROPLASTY (Right) Principal Problem:   OA (osteoarthritis) of knee  Estimated body mass index is 22.48 kg/m as calculated from the following:   Height as of this encounter: 5' 4.5" (1.638 m).   Weight as of this encounter: 60.3 kg. Up with therapy D/C IV fluids  DVT Prophylaxis - Xarelto Weight-bearing as tolerated  Plan for discharge to home this AM after one session of therapy. Scheduled for outpatient PT at [facility] in [place]. Follow-up in the office in 2 weeks with Dr. Lequita Halt.  Arther Abbott, PA-C Orthopedic Surgery 04/06/2018, 8:08 AM

## 2018-04-10 NOTE — Discharge Summary (Signed)
Physician Discharge Summary   Patient ID: Laura Sanders MRN: [id number] DOB/AGE: October 04, 1950 67 y.o.  Admit date: 04/03/2018 Discharge date: 04/06/2018  Primary Diagnosis: Osteoarthritis, right knee   Admission Diagnoses:  Past Medical History:  Diagnosis Date  . Allergy   . Anxiety   . Arthritis    hands, knee  . Complication of anesthesia    REAL BAD NAUSEA   . Depression   . Hypertension   . Insomnia   . Migraines    "RESOLVED"   . Thrombocytopenia ([place])    Discharge Diagnoses:   Principal Problem:   OA (osteoarthritis) of knee  Estimated body mass index is 22.48 kg/m as calculated from the following:   Height as of this encounter: 5' 4.5" (1.638 m).   Weight as of this encounter: 60.3 kg.  Procedure:  Procedure(s) (LRB): RIGHT TOTAL KNEE ARTHROPLASTY (Right)   Consults: None  HPI:  Laura Sanders is a 67 y.o. year old female with end stage OA of her right knee with progressively worsening pain and dysfunction. She has constant pain, with activity and at rest and significant functional deficits with difficulties even with ADLs. She has had extensive non-op management including analgesics, injections of cortisone and viscosupplements, and home exercise program, but remains in significant pain with significant dysfunction.Radiographs show bone on bone arthritis medial and patellofemoral. She presents now for right Total Knee Arthroplasty.  Laboratory Data: Admission on 04/03/2018, Discharged on 04/06/2018  Component Date Value Ref Range Status  . WBC 04/03/2018 7.9  4.0 - 10.5 K/uL Final  . RBC 04/03/2018 4.07  3.87 - 5.11 MIL/uL Final  . Hemoglobin 04/03/2018 12.3  12.0 - 15.0 g/dL Final  . HCT 04/03/2018 37.4  36.0 - 46.0 % Final  . MCV 04/03/2018 91.9  80.0 - 100.0 fL Final  . MCH 04/03/2018 30.2  26.0 - 34.0 pg Final  . MCHC 04/03/2018 32.9  30.0 - 36.0 g/dL Final  . RDW 04/03/2018 11.5  11.5 - 15.5 % Final  . Platelets 04/03/2018 78* 150 - 400 K/uL Final   Comment: REPEATED TO VERIFY PLATELET COUNT CONFIRMED BY SMEAR SPECIMEN CHECKED FOR CLOTS Immature Platelet Fraction may be clinically indicated, consider ordering this additional test [id number]   . nRBC 04/03/2018 0.0  0.0 - 0.2 % Final   Performed at [facility] [address] [place], [place] [address]  . WBC 04/04/2018 13.1* 4.0 - 10.5 K/uL Final  . RBC 04/04/2018 4.00  3.87 - 5.11 MIL/uL Final  . Hemoglobin 04/04/2018 12.1  12.0 - 15.0 g/dL Final  . HCT 04/04/2018 36.2  36.0 - 46.0 % Final  . MCV 04/04/2018 90.5  80.0 - 100.0 fL Final  . MCH 04/04/2018 30.3  26.0 - 34.0 pg Final  . MCHC 04/04/2018 33.4  30.0 - 36.0 g/dL Final  . RDW 04/04/2018 11.2* 11.5 - 15.5 % Final  . Platelets 04/04/2018 77* 150 - 400 K/uL Final   Comment: SPECIMEN CHECKED FOR CLOTS Immature Platelet Fraction may be clinically indicated, consider ordering this additional test [id number] CONSISTENT WITH PREVIOUS RESULT   . nRBC 04/04/2018 0.0  0.0 - 0.2 % Final   Performed at [facility] [address] [place], [place] [address]  . Sodium 04/04/2018 135  135 - 145 mmol/L Final  . Potassium 04/04/2018 3.8  3.5 - 5.1 mmol/L Final  . Chloride 04/04/2018 97* 98 - 111 mmol/L Final  . CO2 04/04/2018 28  22 - 32 mmol/L Final  .  Glucose, Bld 04/04/2018 151* 70 - 99 mg/dL Final  . BUN 04/04/2018 10  8 - 23 mg/dL Final  . Creatinine, Ser 04/04/2018 0.70  0.44 - 1.00 mg/dL Final  . Calcium 04/04/2018 8.4* 8.9 - 10.3 mg/dL Final  . GFR calc non Af Amer 04/04/2018 >60  >60 mL/min Final  . GFR calc Af Amer 04/04/2018 >60  >60 mL/min Final   Comment: (NOTE) The eGFR has been calculated using the CKD EPI equation. This calculation has not been validated in all clinical situations. eGFR's persistently <60 mL/min signify possible Chronic Kidney Disease.   Georgiann Hahn gap 04/04/2018 10  5 - 15 Final   Performed at [facility], [place] [address], [place],  [place] [address]  . WBC 04/05/2018 17.7* 4.0 - 10.5 K/uL Final  . RBC 04/05/2018 3.39* 3.87 - 5.11 MIL/uL Final  . Hemoglobin 04/05/2018 10.1* 12.0 - 15.0 g/dL Final  . HCT 04/05/2018 31.4* 36.0 - 46.0 % Final  . MCV 04/05/2018 92.6  80.0 - 100.0 fL Final  . MCH 04/05/2018 29.8  26.0 - 34.0 pg Final  . MCHC 04/05/2018 32.2  30.0 - 36.0 g/dL Final  . RDW 04/05/2018 11.6  11.5 - 15.5 % Final  . Platelets 04/05/2018 109* 150 - 400 K/uL Final   Comment: REPEATED TO VERIFY SPECIMEN CHECKED FOR CLOTS Immature Platelet Fraction may be clinically indicated, consider ordering this additional test [id number] CONSISTENT WITH PREVIOUS RESULT   . nRBC 04/05/2018 0.0  0.0 - 0.2 % Final   Performed at [facility] [address] [place], [place] [address]  . Sodium 04/05/2018 136  135 - 145 mmol/L Final  . Potassium 04/05/2018 4.1  3.5 - 5.1 mmol/L Final  . Chloride 04/05/2018 97* 98 - 111 mmol/L Final  . CO2 04/05/2018 32  22 - 32 mmol/L Final  . Glucose, Bld 04/05/2018 104* 70 - 99 mg/dL Final  . BUN 04/05/2018 11  8 - 23 mg/dL Final  . Creatinine, Ser 04/05/2018 0.66  0.44 - 1.00 mg/dL Final  . Calcium 04/05/2018 8.9  8.9 - 10.3 mg/dL Final  . GFR calc non Af Amer 04/05/2018 >60  >60 mL/min Final  . GFR calc Af Amer 04/05/2018 >60  >60 mL/min Final   Comment: (NOTE) The eGFR has been calculated using the CKD EPI equation. This calculation has not been validated in all clinical situations. eGFR's persistently <60 mL/min signify possible Chronic Kidney Disease.   Georgiann Hahn gap 04/05/2018 7  5 - 15 Final   Performed at [facility], [place] [address] [place], [place] [address]  . WBC 04/06/2018 11.3* 4.0 - 10.5 K/uL Final  . RBC 04/06/2018 3.25* 3.87 - 5.11 MIL/uL Final  . Hemoglobin 04/06/2018 9.8* 12.0 - 15.0 g/dL Final  . HCT 04/06/2018 29.8* 36.0 - 46.0 % Final  . MCV 04/06/2018 91.7  80.0 - 100.0 fL Final  . MCH 04/06/2018 30.2  26.0 - 34.0 pg Final  . MCHC  04/06/2018 32.9  30.0 - 36.0 g/dL Final  . RDW 04/06/2018 11.7  11.5 - 15.5 % Final  . Platelets 04/06/2018 114* 150 - 400 K/uL Final   Comment: Immature Platelet Fraction may be clinically indicated, consider ordering this additional test [id number] CONSISTENT WITH PREVIOUS RESULT   . nRBC 04/06/2018 0.0  0.0 - 0.2 % Final   Performed at [facility] [address], [place], [place] [address]  Hospital Outpatient Visit on 03/28/2018  Component Date  Value Ref Range Status  . MRSA, PCR 03/28/2018 NEGATIVE  NEGATIVE Final  . Staphylococcus aureus 03/28/2018 NEGATIVE  NEGATIVE Final   Comment: (NOTE) The Xpert SA Assay (FDA approved for NASAL specimens in patients 62 years of age and older), is one component of a comprehensive surveillance program. It is not intended to diagnose infection nor to guide or monitor treatment. Performed at [facility], [place] [address], [place], [place] [address]   . aPTT 03/28/2018 31  24 - 36 seconds Final   Performed at [facility], [place] [address], [place], [place] [address]  . WBC 03/28/2018 7.5  4.0 - 10.5 K/uL Final  . RBC 03/28/2018 4.17  3.87 - 5.11 MIL/uL Final  . Hemoglobin 03/28/2018 12.6  12.0 - 15.0 g/dL Final  . HCT 03/28/2018 38.5  36.0 - 46.0 % Final  . MCV 03/28/2018 92.3  80.0 - 100.0 fL Final  . MCH 03/28/2018 30.2  26.0 - 34.0 pg Final  . MCHC 03/28/2018 32.7  30.0 - 36.0 g/dL Final  . RDW 03/28/2018 11.5  11.5 - 15.5 % Final  . Platelets 03/28/2018 78* 150 - 400 K/uL Final   Comment: REPEATED TO VERIFY PLATELET COUNT CONFIRMED BY SMEAR SPECIMEN CHECKED FOR CLOTS Immature Platelet Fraction may be clinically indicated, consider ordering this additional test [id number]   . nRBC 03/28/2018 0.0  0.0 - 0.2 % Final   Performed at [facility] [address] [place], [place] [address]  . Sodium 03/28/2018 139  135 - 145 mmol/L Final  . Potassium 03/28/2018  4.2  3.5 - 5.1 mmol/L Final  . Chloride 03/28/2018 99  98 - 111 mmol/L Final  . CO2 03/28/2018 33* 22 - 32 mmol/L Final  . Glucose, Bld 03/28/2018 76  70 - 99 mg/dL Final  . BUN 03/28/2018 13  8 - 23 mg/dL Final  . Creatinine, Ser 03/28/2018 0.81  0.44 - 1.00 mg/dL Final  . Calcium 03/28/2018 9.7  8.9 - 10.3 mg/dL Final  . Total Protein 03/28/2018 7.4  6.5 - 8.1 g/dL Final  . Albumin 03/28/2018 3.8  3.5 - 5.0 g/dL Final  . AST 03/28/2018 19  15 - 41 U/L Final  . ALT 03/28/2018 13  0 - 44 U/L Final  . Alkaline Phosphatase 03/28/2018 44  38 - 126 U/L Final  . Total Bilirubin 03/28/2018 0.6  0.3 - 1.2 mg/dL Final  . GFR calc non Af Amer 03/28/2018 >60  >60 mL/min Final  . GFR calc Af Amer 03/28/2018 >60  >60 mL/min Final   Comment: (NOTE) The eGFR has been calculated using the CKD EPI equation. This calculation has not been validated in all clinical situations. eGFR's persistently <60 mL/min signify possible Chronic Kidney Disease.   Georgiann Hahn gap 03/28/2018 7  5 - 15 Final   Performed at [facility], [place] [address], [place], [place] [address]  . Prothrombin Time 03/28/2018 12.3  11.4 - 15.2 seconds Final  . INR 03/28/2018 0.92   Final   Performed at [facility] [address], [place], [place] [address]  . ABO/RH(D) 03/28/2018 B POS   Final  . Antibody Screen 03/28/2018 NEG   Final  . Sample Expiration 03/28/2018 04/06/2018   Final  . Extend sample reason 03/28/2018    Final                   Value:NO TRANSFUSIONS OR PREGNANCY IN THE PAST 3 MONTHS  Performed at [facility], [place] [address] [place], [place] [address]   . ABO/RH(D) 03/28/2018    Final                   Value:B POS Performed at [facility], [place] [address] [place], [place] [address]      X-Rays:No results found.  EKG:No orders found for this or any previous visit.   Hospital Course: Laura PETSCH is a 67 y.o. who was admitted to [facility]. They were brought to the operating room on 04/03/2018 and underwent Procedure(s): RIGHT TOTAL KNEE ARTHROPLASTY.  Patient tolerated the procedure well and was later transferred to the recovery room and then to the orthopaedic floor for postoperative care. They were given PO and IV analgesics for pain control following their surgery. They were given 24 hours of postoperative antibiotics of  Anti-infectives (From admission, onward)   Start     Dose/Rate Route Frequency Ordered Stop   04/04/18 0600  ceFAZolin (ANCEF) IVPB 2g/100 mL premix     2 g 200 mL/hr over 30 Minutes Intravenous On call to O.R. 04/03/18 1254 04/04/18 0630   04/03/18 2100  ceFAZolin (ANCEF) IVPB 1 g/50 mL premix     1 g 100 mL/hr over 30 Minutes Intravenous Every 6 hours 04/03/18 1844 04/04/18 0241     and started on DVT prophylaxis in the form of Xarelto.   PT and OT were ordered for total joint protocol. Discharge planning consulted to help with postop disposition and equipment needs. Patient had a decent night on the evening of surgery. They started to get up OOB with therapy on POD #1. BP was noted to be soft, 500 mL bolus ordered. Hemovac drain was pulled without difficulty on day one. Continued to work with therapy into POD #2. Pt was seen during rounds on day two and was ready to go home pending progress with therapy. Dressing was changed and the incision was clean, dry, and intact. She has having difficulties with anxiety and pain later in day two, and it was decided to keep patient for one additional night in the hospital. She was seen on POD #3 during rounds and was feeling much improved and ready to go home. Incision was healing well.  Pt worked with therapy for two additional sessions and was meeting their goals. She was discharged to home later that day in stable condition.  Diet: Regular diet Activity: WBAT Follow-up: in 2 weeks with Dr. Wynelle Link Disposition: Home with outpatient physical therapy at  [facility] in [place]. Discharged Condition: stable   Discharge Instructions    Call MD / Call 911   Complete by:  As directed    If you experience chest pain or shortness of breath, CALL 911 and be transported to the hospital emergency room.  If you develope a fever above 101 F, pus (white drainage) or increased drainage or redness at the wound, or calf pain, call your surgeon's office.   Change dressing   Complete by:  As directed    Change the dressing daily with sterile 4 x 4 inch gauze dressing and apply TED hose.   Constipation Prevention   Complete by:  As directed    Drink plenty of fluids.  Prune juice may be helpful.  You may use a stool softener, such as Colace (over the counter) 100 mg twice a day.  Use MiraLax (over the counter) for constipation as needed.   Diet - low  sodium heart healthy   Complete by:  As directed    Discharge instructions   Complete by:  As directed    Dr. Gaynelle Arabian Total Joint Specialist [facility] [address], [place], [place] [address] [phone]  TOTAL KNEE REPLACEMENT POSTOPERATIVE DIRECTIONS  Knee Rehabilitation, Guidelines Following Surgery  Results after knee surgery are often greatly improved when you follow the exercise, range of motion and muscle strengthening exercises prescribed by your doctor. Safety measures are also important to protect the knee from further injury. Any time any of these exercises cause you to have increased pain or swelling in your knee joint, decrease the amount until you are comfortable again and slowly increase them. If you have problems or questions, call your caregiver or physical therapist for advice.   HOME CARE INSTRUCTIONS  Remove items at home which could result in a fall. This includes throw rugs or furniture in walking pathways.  ICE to the affected knee every three hours for 30 minutes at a time and then as needed for pain and swelling.  Continue to use ice on the knee for pain and  swelling from surgery. You may notice swelling that will progress down to the foot and ankle.  This is normal after surgery.  Elevate the leg when you are not up walking on it.   Continue to use the breathing machine which will help keep your temperature down.  It is common for your temperature to cycle up and down following surgery, especially at night when you are not up moving around and exerting yourself.  The breathing machine keeps your lungs expanded and your temperature down. Do not place pillow under knee, focus on keeping the knee straight while resting   DIET You may resume your previous home diet once your are discharged from the hospital.  DRESSING / WOUND CARE / SHOWERING You may shower 3 days after surgery, but keep the wounds dry during showering.  You may use an occlusive plastic wrap (Press'n Seal for example), NO SOAKING/SUBMERGING IN THE BATHTUB.  If the bandage gets wet, change with a clean dry gauze.  If the incision gets wet, pat the wound dry with a clean towel. You may start showering once you are discharged home but do not submerge the incision under water. Just pat the incision dry and apply a dry gauze dressing on daily. Change the surgical dressing daily and reapply a dry dressing each time.  ACTIVITY Walk with your walker as instructed. Use walker as long as suggested by your caregivers. Avoid periods of inactivity such as sitting longer than an hour when not asleep. This helps prevent blood clots.  You may resume a sexual relationship in one month or when given the OK by your doctor.  You may return to work once you are cleared by your doctor.  Do not drive a car for 6 weeks or until released by you surgeon.  Do not drive while taking narcotics.  WEIGHT BEARING Weight bearing as tolerated with assist device (walker, cane, etc) as directed, use it as long as suggested by your surgeon or therapist, typically at least 4-6 weeks.  POSTOPERATIVE CONSTIPATION  PROTOCOL Constipation - defined medically as fewer than three stools per week and severe constipation as less than one stool per week.  One of the most common issues patients have following surgery is constipation.  Even if you have a regular bowel pattern at home, your normal regimen is likely to be disrupted due  to multiple reasons following surgery.  Combination of anesthesia, postoperative narcotics, change in appetite and fluid intake all can affect your bowels.  In order to avoid complications following surgery, here are some recommendations in order to help you during your recovery period.  Colace (docusate) - Pick up an over-the-counter form of Colace or another stool softener and take twice a day as long as you are requiring postoperative pain medications.  Take with a full glass of water daily.  If you experience loose stools or diarrhea, hold the colace until you stool forms back up.  If your symptoms do not get better within 1 week or if they get worse, check with your doctor.  Dulcolax (bisacodyl) - Pick up over-the-counter and take as directed by the product packaging as needed to assist with the movement of your bowels.  Take with a full glass of water.  Use this product as needed if not relieved by Colace only.   MiraLax (polyethylene glycol) - Pick up over-the-counter to have on hand.  MiraLax is a solution that will increase the amount of water in your bowels to assist with bowel movements.  Take as directed and can mix with a glass of water, juice, soda, coffee, or tea.  Take if you go more than two days without a movement. Do not use MiraLax more than once per day. Call your doctor if you are still constipated or irregular after using this medication for 7 days in a row.  If you continue to have problems with postoperative constipation, please contact the office for further assistance and recommendations.  If you experience "the worst abdominal pain ever" or develop nausea or  vomiting, please contact the office immediatly for further recommendations for treatment.  ITCHING  If you experience itching with your medications, try taking only a single pain pill, or even half a pain pill at a time.  You can also use Benadryl over the counter for itching or also to help with sleep.   TED HOSE STOCKINGS Wear the elastic stockings on both legs for three weeks following surgery during the day but you may remove then at night for sleeping.  MEDICATIONS See your medication summary on the "After Visit Summary" that the nursing staff will review with you prior to discharge.  You may have some home medications which will be placed on hold until you complete the course of blood thinner medication.  It is important for you to complete the blood thinner medication as prescribed by your surgeon.  Continue your approved medications as instructed at time of discharge.  PRECAUTIONS If you experience chest pain or shortness of breath - call 911 immediately for transfer to the hospital emergency department.  If you develop a fever greater that 101 F, purulent drainage from wound, increased redness or drainage from wound, foul odor from the wound/dressing, or calf pain - CONTACT YOUR SURGEON.                                                   FOLLOW-UP APPOINTMENTS Make sure you keep all of your appointments after your operation with your surgeon and caregivers. You should call the office at the above phone number and make an appointment for approximately two weeks after the date of your surgery or on the date instructed by your surgeon outlined in the "After  Visit Summary".   RANGE OF MOTION AND STRENGTHENING EXERCISES  Rehabilitation of the knee is important following a knee injury or an operation. After just a few days of immobilization, the muscles of the thigh which control the knee become weakened and shrink (atrophy). Knee exercises are designed to build up the tone and strength of the  thigh muscles and to improve knee motion. Often times heat used for twenty to thirty minutes before working out will loosen up your tissues and help with improving the range of motion but do not use heat for the first two weeks following surgery. These exercises can be done on a training (exercise) mat, on the floor, on a table or on a bed. Use what ever works the best and is most comfortable for you Knee exercises include:  Leg Lifts - While your knee is still immobilized in a splint or cast, you can do straight leg raises. Lift the leg to 60 degrees, hold for 3 sec, and slowly lower the leg. Repeat 10-20 times 2-3 times daily. Perform this exercise against resistance later as your knee gets better.  Quad and Hamstring Sets - Tighten up the muscle on the front of the thigh (Quad) and hold for 5-10 sec. Repeat this 10-20 times hourly. Hamstring sets are done by pushing the foot backward against an object and holding for 5-10 sec. Repeat as with quad sets.  Leg Slides: Lying on your back, slowly slide your foot toward your buttocks, bending your knee up off the floor (only go as far as is comfortable). Then slowly slide your foot back down until your leg is flat on the floor again. Angel Wings: Lying on your back spread your legs to the side as far apart as you can without causing discomfort.  A rehabilitation program following serious knee injuries can speed recovery and prevent re-injury in the future due to weakened muscles. Contact your doctor or a physical therapist for more information on knee rehabilitation.   IF YOU ARE TRANSFERRED TO A SKILLED REHAB FACILITY If the patient is transferred to a skilled rehab facility following release from the hospital, a list of the current medications will be sent to the facility for the patient to continue.  When discharged from the skilled rehab facility, please have the facility set up the patient's [place] prior to being released. Also, the  skilled facility will be responsible for providing the patient with their medications at time of release from the facility to include their pain medication, the muscle relaxants, and their blood thinner medication. If the patient is still at the rehab facility at time of the two week follow up appointment, the skilled rehab facility will also need to assist the patient in arranging follow up appointment in our office and any transportation needs.  MAKE SURE YOU:  Understand these instructions.  Get help right away if you are not doing well or get worse.    Pick up stool softner and laxative for home use following surgery while on pain medications. Do not submerge incision under water. Please use good hand washing techniques while changing dressing each day. May shower starting three days after surgery. Please use a clean towel to pat the incision dry following showers. Continue to use ice for pain and swelling after surgery. Do not use any lotions or creams on the incision until instructed by your surgeon.   Do not put a pillow under the knee. Place it under the heel.  Complete by:  As directed    Driving restrictions   Complete by:  As directed    No driving for two weeks   TED hose   Complete by:  As directed    Use stockings (TED hose) for three weeks on both leg(s).  You may remove them at night for sleeping.   Weight bearing as tolerated   Complete by:  As directed      Allergies as of 04/06/2018      Reactions   Bupropion Other (See Comments)   Makes patient nervous      Medication List    STOP taking these medications   celecoxib 200 MG capsule Commonly known as:  CELEBREX   cholecalciferol 1000 units tablet Commonly known as:  VITAMIN D   estradiol 2 MG tablet Commonly known as:  ESTRACE   PROBIOTIC PO     TAKE these medications   ALPRAZolam 0.5 MG tablet Commonly known as:  XANAX Take 0.5 mg by mouth every 8 (eight) hours as needed for anxiety.    bisoprolol-hydrochlorothiazide 5-6.25 MG tablet Commonly known as:  ZIAC Take 1 tablet by mouth 2 (two) times daily.   cloNIDine 0.2 MG tablet Commonly known as:  CATAPRES Take 1 tablet (0.2 mg total) by mouth 2 (two) times daily.   gabapentin 300 MG capsule Commonly known as:  NEURONTIN Take 1 capsule (300 mg total) by mouth 3 (three) times daily. Gabapentin 300 mg Protocol Take a 300 mg capsule three times a day for two weeks following surgery. Then take a 300 mg capsule two times a day for two weeks.  Then take a 300 mg capsule once a day for two weeks.  Then discontinue the Gabapentin.   methocarbamol 500 MG tablet Commonly known as:  ROBAXIN Take 1 tablet (500 mg total) by mouth every 6 (six) hours as needed for muscle spasms.   oxyCODONE 5 MG immediate release tablet Commonly known as:  Oxy IR/ROXICODONE Take 1-2 tablets (5-10 mg total) by mouth every 6 (six) hours as needed for moderate pain (pain score 4-6).   rivaroxaban 10 MG Tabs tablet Commonly known as:  XARELTO Take 1 tablet (10 mg total) by mouth daily with breakfast for 18 days. Then take one 81 mg aspirin once a day for three weeks. Then discontinue aspirin.   traMADol 50 MG tablet Commonly known as:  ULTRAM Take 50 mg by mouth every 6 (six) hours as needed for moderate pain.   traZODone 100 MG tablet Commonly known as:  DESYREL Take 100 mg by mouth at bedtime.   venlafaxine XR 75 MG 24 hr capsule Commonly known as:  EFFEXOR-XR Take 225 mg by mouth daily with breakfast.            Discharge Care Instructions  (From admission, onward)         Start     Ordered   04/04/18 0000  Weight bearing as tolerated     04/04/18 0805   04/04/18 0000  Change dressing    Comments:  Change the dressing daily with sterile 4 x 4 inch gauze dressing and apply TED hose.   04/04/18 0805         Follow-up Information    Gaynelle Arabian, MD. Schedule an appointment as soon as possible for a visit on 04/18/2018.    Specialty:  Orthopedic Surgery Contact information: [address] [place] [address] [phone]           Signed:  Theresa Duty, PA-C Orthopedic Surgery 04/10/2018, 11:54 AM

## 2018-04-18 DIAGNOSIS — Z96651 Presence of right artificial knee joint: Secondary | ICD-10-CM | POA: Insufficient documentation

## 2018-08-24 ENCOUNTER — Other Ambulatory Visit: Payer: Self-pay | Admitting: Internal Medicine

## 2018-08-24 DIAGNOSIS — R221 Localized swelling, mass and lump, neck: Secondary | ICD-10-CM

## 2018-09-06 ENCOUNTER — Ambulatory Visit: Payer: Medicare Other

## 2018-09-08 ENCOUNTER — Ambulatory Visit
Admission: RE | Admit: 2018-09-08 | Discharge: 2018-09-08 | Disposition: A | Discharge status: Home / Self Care | Payer: Medicare Other | Source: Ambulatory Visit | Attending: Internal Medicine | Admitting: Internal Medicine

## 2018-09-08 ENCOUNTER — Other Ambulatory Visit: Payer: Self-pay

## 2018-09-08 DIAGNOSIS — R221 Localized swelling, mass and lump, neck: Secondary | ICD-10-CM | POA: Diagnosis not present

## 2018-09-21 ENCOUNTER — Other Ambulatory Visit (HOSPITAL_COMMUNITY): Payer: Self-pay | Admitting: Internal Medicine

## 2018-09-21 ENCOUNTER — Other Ambulatory Visit: Payer: Self-pay | Admitting: Internal Medicine

## 2018-09-21 DIAGNOSIS — R110 Nausea: Secondary | ICD-10-CM

## 2018-09-21 DIAGNOSIS — R1013 Epigastric pain: Secondary | ICD-10-CM

## 2018-09-21 DIAGNOSIS — R1310 Dysphagia, unspecified: Secondary | ICD-10-CM

## 2018-10-12 ENCOUNTER — Other Ambulatory Visit: Payer: Self-pay | Admitting: Emergency Medicine

## 2018-10-12 DIAGNOSIS — Z1231 Encounter for screening mammogram for malignant neoplasm of breast: Secondary | ICD-10-CM

## 2018-11-13 ENCOUNTER — Ambulatory Visit: Payer: Medicare Other

## 2018-12-07 ENCOUNTER — Ambulatory Visit: Payer: Medicare Other

## 2019-02-09 ENCOUNTER — Ambulatory Visit
Admission: RE | Admit: 2019-02-09 | Discharge: 2019-02-09 | Disposition: A | Discharge status: Home / Self Care | Payer: 59 | Source: Ambulatory Visit | Attending: Emergency Medicine | Admitting: Emergency Medicine

## 2019-02-09 ENCOUNTER — Other Ambulatory Visit: Payer: Self-pay

## 2019-02-09 DIAGNOSIS — Z1231 Encounter for screening mammogram for malignant neoplasm of breast: Secondary | ICD-10-CM

## 2019-07-16 DIAGNOSIS — S8001XA Contusion of right knee, initial encounter: Secondary | ICD-10-CM | POA: Insufficient documentation

## 2019-09-21 NOTE — Progress Notes (Signed)
[facility]  Telephone:(336) [phone] Fax:(336) [phone]  ID: Royston Bake OB: 1950/09/13  MR#: [id number]  LOV#:[id number]  Patient Care Team: Idelle Crouch, MD as PCP - General (Internal Medicine)  CHIEF COMPLAINT: Thrombocytopenia.  INTERVAL HISTORY: Patient is a 69 year old female who was last evaluated greater than 5 years ago and is referred back for persistent thrombocytopenia.  She has increased weakness and fatigue, but otherwise feels well.  She does not complain of significant bruising.  She has no neurologic complaints.  She denies any recent fevers or illnesses.  She has a good appetite and denies weight loss.  She has no chest pain, shortness of breath, cough, or hemoptysis.  She denies any nausea, vomiting, constipation, or diarrhea.  She has no urinary complaints.  Patient offers no further specific complaints.  REVIEW OF SYSTEMS:   Review of Systems  Constitutional: Positive for malaise/fatigue. Negative for fever and weight loss.  Respiratory: Negative.  Negative for cough, hemoptysis and shortness of breath.   Cardiovascular: Negative.  Negative for chest pain and leg swelling.  Gastrointestinal: Negative.  Negative for abdominal pain.  Genitourinary: Negative.  Negative for dysuria and hematuria.  Musculoskeletal: Negative.  Negative for back pain.  Skin: Negative.  Negative for rash.  Neurological: Positive for weakness. Negative for dizziness, focal weakness and headaches.  Psychiatric/Behavioral: Negative.  The patient is not nervous/anxious and does not have insomnia.     As per HPI. Otherwise, a complete review of systems is negative.  PAST MEDICAL HISTORY: Past Medical History:  Diagnosis Date  . Allergy   . Anxiety   . Arthritis    hands, knee  . Complication of anesthesia    REAL BAD NAUSEA   . Depression   . Hypertension   . Insomnia   . Migraines    "RESOLVED"   . Thrombocytopenia ([place])     PAST SURGICAL HISTORY: Past  Surgical History:  Procedure Laterality Date  . AUGMENTATION MAMMAPLASTY Bilateral   . BREAST ENHANCEMENT SURGERY    . COLONOSCOPY  02/18/2004   [facility] -normal  . ESOPHAGOGASTRODUODENOSCOPY ENDOSCOPY    . TONSILLECTOMY    . TOTAL KNEE ARTHROPLASTY Right 04/03/2018   Procedure: RIGHT TOTAL KNEE ARTHROPLASTY;  Surgeon: Gaynelle Arabian, MD;  Location: [facility];  Service: Orthopedics;  Laterality: Right;  1mn  . VAGINAL HYSTERECTOMY      FAMILY HISTORY: Family History  Problem Relation Age of Onset  . Colon polyps Sister   . Stomach cancer Paternal Aunt   . Colon cancer Neg Hx   . Esophageal cancer Neg Hx   . Rectal cancer Neg Hx     ADVANCED DIRECTIVES (Y/N):  N  HEALTH MAINTENANCE: Social History   Tobacco Use  . Smoking status: Never Smoker  . Smokeless tobacco: Never Used  Substance Use Topics  . Alcohol use: Yes    Alcohol/week: 1.0 standard drinks    Types: 1 Glasses of wine per week  . Drug use: No     Colonoscopy:  PAP:  Bone density:  Lipid panel:  Allergies  Allergen Reactions  . Bupropion Other (See Comments)    Makes patient nervous    Current Outpatient Medications  Medication Sig Dispense Refill  . acetaminophen (TYLENOL) 325 MG tablet Take 325 mg by mouth as needed.     . ALPRAZolam (XANAX) 0.5 MG tablet Take 0.5 mg by mouth every 8 (eight) hours as needed for anxiety.   3  . estradiol (ESTRACE) 2 MG tablet Take  2 mg by mouth daily.    Marland Kitchen HYDROcodone-acetaminophen (NORCO) 10-325 MG tablet Take 1 tablet by mouth every 6 (six) hours as needed.    . pantoprazole (PROTONIX) 40 MG tablet Take 40 mg by mouth 2 (two) times daily.    . traZODone (DESYREL) 100 MG tablet Take 100 mg by mouth at bedtime.    Marland Kitchen venlafaxine XR (EFFEXOR-XR) 75 MG 24 hr capsule Take 225 mg by mouth daily with breakfast.     . bisoprolol-hydrochlorothiazide (ZIAC) 5-6.25 MG tablet Take 1 tablet by mouth 2 (two) times daily.  11   No current facility-administered medications for this  visit.    OBJECTIVE: Vitals:   09/25/19 0940  BP: 107/63  Pulse: 66  Resp: 18  Temp: (!) 95.9 F (35.5 C)  SpO2: 100%     Body mass index is 23.19 kg/m.    ECOG FS:0 - Asymptomatic  General: Well-developed, well-nourished, no acute distress. Eyes: Pink conjunctiva, anicteric sclera. HEENT: Normocephalic, moist mucous membranes. Lungs: No audible wheezing or coughing. Heart: Regular rate and rhythm. Abdomen: Soft, nontender, no obvious distention. Musculoskeletal: No edema, cyanosis, or clubbing. Neuro: Alert, answering all questions appropriately. Cranial nerves grossly intact. Skin: No rashes or petechiae noted. Psych: Normal affect. Lymphatics: No cervical, calvicular, axillary or inguinal LAD.   LAB RESULTS:  Lab Results  Component Value Date   NA 136 04/05/2018   K 4.1 04/05/2018   CL 97 (L) 04/05/2018   CO2 32 04/05/2018   GLUCOSE 104 (H) 04/05/2018   BUN 11 04/05/2018   CREATININE 0.66 04/05/2018   CALCIUM 8.9 04/05/2018   PROT 7.4 03/28/2018   ALBUMIN 3.8 03/28/2018   AST 19 03/28/2018   ALT 13 03/28/2018   ALKPHOS 44 03/28/2018   BILITOT 0.6 03/28/2018   GFRNONAA >60 04/05/2018   GFRAA >60 04/05/2018    Lab Results  Component Value Date   WBC 6.7 09/25/2019   NEUTROABS 6.5 02/03/2016   HGB 11.6 (L) 09/25/2019   HCT 34.0 (L) 09/25/2019   MCV 88.3 09/25/2019   PLT 112 (L) 09/25/2019     STUDIES: No results found.  ASSESSMENT: Thrombocytopenia  PLAN:    1. Thrombocytopenia: Patient's platelet count today is 112 which is essentially unchanged for greater than 5 years.  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  Patient has a normal iron panel and folate level, but the remainder of her laboratory work from today is pending at time of dictation.  Abdominal ultrasound in November 2016 did not reveal splenomegaly.  No intervention is needed at this time.  Patient does not  require bone marrow biopsy.  She will have a video assisted telemedicine visit in 3 weeks for further evaluation and discussion of her results. 2.  Weakness and fatigue: Unrelated to her thrombocytopenia.  Patient only has a mild anemia which is likely not contributing.  The remainder of her laboratory work including thyroid panel is pending at time of dictation.  I spent a total of 45 minutes reviewing chart data, face-to-face evaluation with the patient, counseling and coordination of care as detailed above.   Patient expressed understanding and was in agreement with this plan. She also understands that She can call clinic at any time with any questions, concerns, or complaints.    Lloyd Huger, MD   09/25/2019 1:29 PM

## 2019-09-25 ENCOUNTER — Encounter (INDEPENDENT_AMBULATORY_CARE_PROVIDER_SITE_OTHER): Payer: Self-pay

## 2019-09-25 ENCOUNTER — Inpatient Hospital Stay: Payer: Medicare Other | Attending: Oncology | Admitting: Oncology

## 2019-09-25 ENCOUNTER — Encounter: Payer: Self-pay | Admitting: Oncology

## 2019-09-25 ENCOUNTER — Other Ambulatory Visit: Payer: Self-pay

## 2019-09-25 ENCOUNTER — Inpatient Hospital Stay: Payer: 59 | Admitting: Oncology

## 2019-09-25 ENCOUNTER — Inpatient Hospital Stay: Payer: Medicare Other

## 2019-09-25 ENCOUNTER — Inpatient Hospital Stay: Payer: 59

## 2019-09-25 VITALS — BP 107/63 | HR 66 | Temp 95.9°F | Resp 18 | Wt 137.2 lb

## 2019-09-25 DIAGNOSIS — F419 Anxiety disorder, unspecified: Secondary | ICD-10-CM

## 2019-09-25 DIAGNOSIS — D696 Thrombocytopenia, unspecified: Secondary | ICD-10-CM | POA: Insufficient documentation

## 2019-09-25 DIAGNOSIS — R531 Weakness: Secondary | ICD-10-CM | POA: Insufficient documentation

## 2019-09-25 DIAGNOSIS — Z79899 Other long term (current) drug therapy: Secondary | ICD-10-CM | POA: Insufficient documentation

## 2019-09-25 DIAGNOSIS — I10 Essential (primary) hypertension: Secondary | ICD-10-CM | POA: Diagnosis not present

## 2019-09-25 DIAGNOSIS — R5383 Other fatigue: Secondary | ICD-10-CM | POA: Insufficient documentation

## 2019-09-25 DIAGNOSIS — F329 Major depressive disorder, single episode, unspecified: Secondary | ICD-10-CM | POA: Insufficient documentation

## 2019-09-25 LAB — CBC
HCT: 34 % — ABNORMAL LOW (ref 36.0–46.0)
Hemoglobin: 11.6 g/dL — ABNORMAL LOW (ref 12.0–15.0)
MCH: 30.1 pg (ref 26.0–34.0)
MCHC: 34.1 g/dL (ref 30.0–36.0)
MCV: 88.3 fL (ref 80.0–100.0)
Platelets: 112 10*3/uL — ABNORMAL LOW (ref 150–400)
RBC: 3.85 MIL/uL — ABNORMAL LOW (ref 3.87–5.11)
RDW: 11.4 % — ABNORMAL LOW (ref 11.5–15.5)
WBC: 6.7 10*3/uL (ref 4.0–10.5)
nRBC: 0 % (ref 0.0–0.2)

## 2019-09-25 LAB — IRON AND TIBC
Iron: 83 ug/dL (ref 28–170)
Saturation Ratios: 26 % (ref 10.4–31.8)
TIBC: 325 ug/dL (ref 250–450)
UIBC: 242 ug/dL

## 2019-09-25 LAB — FERRITIN: Ferritin: 48 ng/mL (ref 11–307)

## 2019-09-25 LAB — LACTATE DEHYDROGENASE: LDH: 97 U/L — ABNORMAL LOW (ref 98–192)

## 2019-09-25 LAB — FOLATE: Folate: 10.9 ng/mL (ref >5.9)

## 2019-09-25 LAB — VITAMIN B12: Vitamin B-12: 354 pg/mL (ref 180–914)

## 2019-09-26 LAB — HAPTOGLOBIN: Haptoglobin: 112 mg/dL (ref 37–355)

## 2019-09-26 LAB — PROTEIN ELECTROPHORESIS, SERUM
A/G Ratio: 1.1 (ref 0.7–1.7)
Albumin ELP: 3.6 g/dL (ref 2.9–4.4)
Alpha-1-Globulin: 0.3 g/dL (ref 0.0–0.4)
Alpha-2-Globulin: 0.6 g/dL (ref 0.4–1.0)
Beta Globulin: 1 g/dL (ref 0.7–1.3)
Gamma Globulin: 1.3 g/dL (ref 0.4–1.8)
Globulin, Total: 3.2 g/dL (ref 2.2–3.9)
Total Protein ELP: 6.8 g/dL (ref 6.0–8.5)

## 2019-09-26 LAB — ANA W/REFLEX: Anti Nuclear Antibody (ANA): NEGATIVE

## 2019-09-26 LAB — THYROID PANEL WITH TSH
Free Thyroxine Index: 1.7 (ref 1.2–4.9)
T3 Uptake Ratio: 20 % — ABNORMAL LOW (ref 24–39)
T4, Total: 8.4 ug/dL (ref 4.5–12.0)
TSH: 4.2 u[IU]/mL (ref 0.450–4.500)

## 2019-09-26 LAB — PLATELET ANTIBODY PROFILE
Glycoprotein IV Antibody: NEGATIVE
HLA Ab Ser Ql EIA: NEGATIVE
IA/IIA Antibody: NEGATIVE
IB/IX Antibody: NEGATIVE
IIB/IIIA Antibody: NEGATIVE

## 2019-10-13 NOTE — Progress Notes (Signed)
  [facility]  Telephone:(336) [phone] Fax:(336) [phone]  ID: Laura Sanders OB: [birth year]  MR#: [id number]  IXV#:[id number]    Jeralyn Ruths, MD   10/17/2019 6:47 AM     This encounter was created in error - please disregard.

## 2019-10-16 ENCOUNTER — Inpatient Hospital Stay: Payer: 59 | Attending: Oncology | Admitting: Oncology

## 2019-10-16 DIAGNOSIS — D696 Thrombocytopenia, unspecified: Secondary | ICD-10-CM

## 2019-10-19 ENCOUNTER — Encounter: Payer: Self-pay | Admitting: Oncology

## 2019-10-19 ENCOUNTER — Other Ambulatory Visit: Payer: Self-pay

## 2019-10-19 ENCOUNTER — Inpatient Hospital Stay (HOSPITAL_BASED_OUTPATIENT_CLINIC_OR_DEPARTMENT_OTHER): Payer: 59 | Admitting: Oncology

## 2019-10-19 DIAGNOSIS — D696 Thrombocytopenia, unspecified: Secondary | ICD-10-CM | POA: Diagnosis not present

## 2019-10-19 NOTE — Progress Notes (Signed)
Patient complains of being very fatigued and also states she has no appetite. Patient also states she has noticed discoloration in left arm.

## 2019-10-19 NOTE — Progress Notes (Signed)
[facility]  Telephone:(336) [phone] Fax:(336) [phone]  ID: Laura Sanders OB: November 18, 1950  MR#: [id number]  YJE#:[id number]  Patient Care Team: Idelle Crouch, MD as PCP - General (Internal Medicine) Lloyd Huger, MD as Consulting Physician (Oncology)  I connected with Laura Sanders on 10/19/19 at  3:00 PM EDT by video enabled telemedicine visit and verified that I am speaking with the correct person using two identifiers.   I discussed the limitations, risks, security and privacy concerns of performing an evaluation and management service by telemedicine and the availability of in-person appointments. I also discussed with the patient that there may be a patient responsible charge related to this service. The patient expressed understanding and agreed to proceed.   Other persons participating in the visit and their role in the encounter: Patient, MD.  Patient's location: Home. Provider's location: Clinic.  CHIEF COMPLAINT: Thrombocytopenia.  INTERVAL HISTORY: Patient agreed to video assisted telemedicine visit for further evaluation and discussion of her laboratory results.  She continues to have chronic weakness and fatigue, but otherwise feels well.  She does not complain of significant bruising.  She has no neurologic complaints.  She denies any recent fevers or illnesses.  She has a good appetite and denies weight loss.  She has no chest pain, shortness of breath, cough, or hemoptysis.  She denies any nausea, vomiting, constipation, or diarrhea.  She has no urinary complaints.  Patient offers no further specific complaints today.  REVIEW OF SYSTEMS:   Review of Systems  Constitutional: Positive for malaise/fatigue. Negative for fever and weight loss.  Respiratory: Negative.  Negative for cough, hemoptysis and shortness of breath.   Cardiovascular: Negative.  Negative for chest pain and leg swelling.  Gastrointestinal: Negative.  Negative for abdominal  pain.  Genitourinary: Negative.  Negative for dysuria and hematuria.  Musculoskeletal: Negative.  Negative for back pain.  Skin: Negative.  Negative for rash.  Neurological: Positive for weakness. Negative for dizziness, focal weakness and headaches.  Psychiatric/Behavioral: Negative.  The patient is not nervous/anxious and does not have insomnia.     As per HPI. Otherwise, a complete review of systems is negative.  PAST MEDICAL HISTORY: Past Medical History:  Diagnosis Date  . Allergy   . Anxiety   . Arthritis    hands, knee  . Complication of anesthesia    REAL BAD NAUSEA   . Depression   . Hypertension   . Insomnia   . Migraines    "RESOLVED"   . Thrombocytopenia ([place])     PAST SURGICAL HISTORY: Past Surgical History:  Procedure Laterality Date  . AUGMENTATION MAMMAPLASTY Bilateral   . BREAST ENHANCEMENT SURGERY    . COLONOSCOPY  02/18/2004   [facility] -normal  . ESOPHAGOGASTRODUODENOSCOPY ENDOSCOPY    . TONSILLECTOMY    . TOTAL KNEE ARTHROPLASTY Right 04/03/2018   Procedure: RIGHT TOTAL KNEE ARTHROPLASTY;  Surgeon: Gaynelle Arabian, MD;  Location: [facility];  Service: Orthopedics;  Laterality: Right;  49mn  . VAGINAL HYSTERECTOMY      FAMILY HISTORY: Family History  Problem Relation Age of Onset  . Colon polyps Sister   . Stomach cancer Paternal Aunt   . Colon cancer Neg Hx   . Esophageal cancer Neg Hx   . Rectal cancer Neg Hx     ADVANCED DIRECTIVES (Y/N):  N  HEALTH MAINTENANCE: Social History   Tobacco Use  . Smoking status: Never Smoker  . Smokeless tobacco: Never Used  Substance Use Topics  . Alcohol  use: Yes    Alcohol/week: 1.0 standard drinks    Types: 1 Glasses of wine per week  . Drug use: No     Colonoscopy:  PAP:  Bone density:  Lipid panel:  Allergies  Allergen Reactions  . Bupropion Other (See Comments)    Makes patient nervous    Current Outpatient Medications  Medication Sig Dispense Refill  . acetaminophen (TYLENOL) 325 MG  tablet Take 325 mg by mouth as needed.     . ALPRAZolam (XANAX) 0.5 MG tablet Take 0.5 mg by mouth every 8 (eight) hours as needed for anxiety.   3  . bisoprolol-hydrochlorothiazide (ZIAC) 5-6.25 MG tablet Take 1 tablet by mouth 2 (two) times daily.  11  . estradiol (ESTRACE) 2 MG tablet Take 2 mg by mouth daily.    Marland Kitchen HYDROcodone-acetaminophen (NORCO) 10-325 MG tablet Take 1 tablet by mouth every 6 (six) hours as needed.    . pantoprazole (PROTONIX) 40 MG tablet Take 40 mg by mouth 2 (two) times daily.    . traZODone (DESYREL) 100 MG tablet Take 100 mg by mouth at bedtime.    Marland Kitchen venlafaxine XR (EFFEXOR-XR) 75 MG 24 hr capsule Take 225 mg by mouth daily with breakfast.      No current facility-administered medications for this visit.    OBJECTIVE: There were no vitals filed for this visit.   There is no height or weight on file to calculate BMI.    ECOG FS:0 - Asymptomatic  General: Well-developed, well-nourished, no acute distress. HEENT: Normocephalic. Neuro: Alert, answering all questions appropriately. Cranial nerves grossly intact. Psych: Normal affect.   LAB RESULTS:  Lab Results  Component Value Date   NA 136 04/05/2018   K 4.1 04/05/2018   CL 97 (L) 04/05/2018   CO2 32 04/05/2018   GLUCOSE 104 (H) 04/05/2018   BUN 11 04/05/2018   CREATININE 0.66 04/05/2018   CALCIUM 8.9 04/05/2018   PROT 7.4 03/28/2018   ALBUMIN 3.8 03/28/2018   AST 19 03/28/2018   ALT 13 03/28/2018   ALKPHOS 44 03/28/2018   BILITOT 0.6 03/28/2018   GFRNONAA >60 04/05/2018   GFRAA >60 04/05/2018    Lab Results  Component Value Date   WBC 6.7 09/25/2019   NEUTROABS 6.5 02/03/2016   HGB 11.6 (L) 09/25/2019   HCT 34.0 (L) 09/25/2019   MCV 88.3 09/25/2019   PLT 112 (L) 09/25/2019     STUDIES: No results found.  ASSESSMENT: Thrombocytopenia  PLAN:    1. Thrombocytopenia: Patient's most recent platelet count was reported at 112 which has been chronic and unchanged for greater than 5 years.   All of her other laboratory work is either negative or within normal limits.  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  Abdominal ultrasound in November 2016 did not reveal splenomegaly.  No intervention is needed at this time.  Patient does not require bone marrow biopsy.  No further follow-up is necessary.  Please refer patient back if there are any questions or concerns. 2.  Weakness and fatigue: Unrelated to her thrombocytopenia.  Patient only has a mild anemia which is likely not contributing.  The remainder of her laboratory work including thyroid panel is either negative or within normal limits.  Possibly related to poor sleep habits.  Continue follow-up with primary care as indicated.  I provided 20 minutes of face-to-face video visit time during this encounter which included chart review, counseling,  and coordination of care as documented above.    Patient expressed understanding and was in agreement with this plan. She also understands that She can call clinic at any time with any questions, concerns, or complaints.    Lloyd Huger, MD   10/19/2019 10:05 AM

## 2019-11-16 ENCOUNTER — Other Ambulatory Visit: Payer: Self-pay | Admitting: Internal Medicine

## 2019-11-16 DIAGNOSIS — R110 Nausea: Secondary | ICD-10-CM

## 2019-11-16 DIAGNOSIS — R1310 Dysphagia, unspecified: Secondary | ICD-10-CM

## 2019-11-16 DIAGNOSIS — R1013 Epigastric pain: Secondary | ICD-10-CM

## 2020-01-22 ENCOUNTER — Other Ambulatory Visit: Payer: Self-pay | Admitting: Internal Medicine

## 2020-01-22 DIAGNOSIS — Z1231 Encounter for screening mammogram for malignant neoplasm of breast: Secondary | ICD-10-CM

## 2020-04-24 ENCOUNTER — Ambulatory Visit: Payer: Medicare Other

## 2020-06-03 ENCOUNTER — Ambulatory Visit
Admission: RE | Admit: 2020-06-03 | Discharge: 2020-06-03 | Disposition: A | Discharge status: Home / Self Care | Payer: 59 | Source: Ambulatory Visit | Attending: Internal Medicine | Admitting: Internal Medicine

## 2020-06-03 ENCOUNTER — Other Ambulatory Visit: Payer: Self-pay

## 2020-06-03 DIAGNOSIS — Z1231 Encounter for screening mammogram for malignant neoplasm of breast: Secondary | ICD-10-CM

## 2020-09-17 DIAGNOSIS — M5441 Lumbago with sciatica, right side: Secondary | ICD-10-CM | POA: Insufficient documentation

## 2020-09-17 DIAGNOSIS — M5416 Radiculopathy, lumbar region: Secondary | ICD-10-CM | POA: Insufficient documentation

## 2020-11-24 DIAGNOSIS — M48061 Spinal stenosis, lumbar region without neurogenic claudication: Secondary | ICD-10-CM | POA: Insufficient documentation

## 2020-12-04 ENCOUNTER — Telehealth: Payer: Self-pay | Admitting: Oncology

## 2020-12-04 NOTE — Telephone Encounter (Signed)
Patient was referred by her orthopedic surgeon to have her "platelet level" checked prior to surgery. Last seen on 11/08/19.   Please advise.

## 2020-12-09 ENCOUNTER — Other Ambulatory Visit: Payer: Self-pay | Admitting: Neurosurgery

## 2021-01-26 NOTE — Progress Notes (Signed)
Surgical Instructions    Your procedure is scheduled on Tuesday, August 23rd, 2022.   Report to [facility] Main Entrance "A" at 07:45 A.M., then check in with the Admitting office.  Call this number if you have problems the morning of surgery:  [phone]   If you have any questions prior to your surgery date call [phone]: Open Monday-Friday 8am-4pm    Remember:  Do not eat after midnight the night before your surgery  You may drink clear liquids until 06:45 the morning of your surgery.   Clear liquids allowed are: Water, Non-Citrus Juices (without pulp), Carbonated Beverages, Clear Tea, Black Coffee Only, and Gatorade    Take these medicines the morning of surgery with A SIP OF WATER:  pantoprazole (PROTONIX)  venlafaxine XR (EFFEXOR-XR)   If needed:  acetaminophen (TYLENOL)  ALPRAZolam (XANAX)  fexofenadine-pseudoephedrine (ALLEGRA-D)  HYDROcodone-acetaminophen (NORCO/VICODIN)  Ask the doctor if you need to stop taking estradiol (ESTRACE) before surgery.  As of today, STOP taking any Aspirin (unless otherwise instructed by your surgeon) Aleve, Naproxen, Ibuprofen, Motrin, Advil, Goody's, BC's, all herbal medications, fish oil, and all vitamins.          Do not wear jewelry or makeup Do not wear lotions, powders, perfumes, or deodorant. Do not shave 48 hours prior to surgery.   Do not bring valuables to the hospital. DO Not wear nail polish, gel polish, artificial nails, or any other type of covering on natural nails including finger and toenails. If patients have artificial nails, gel coating, etc. that need to be removed by a nail salon please have this removed prior to surgery or surgery may need to be canceled/delayed if the surgeon/ anesthesia feels like the patient is unable to be adequately monitored.             [facility] is not responsible for any belongings or valuables.  Do NOT Smoke (Tobacco/Vaping) or drink Alcohol 24 hours prior to your procedure If  you use a CPAP at night, you may bring all equipment for your overnight stay.   Contacts, glasses, dentures or bridgework may not be worn into surgery, please bring cases for these belongings   For patients admitted to the hospital, discharge time will be determined by your treatment team.   Patients discharged the day of surgery will not be allowed to drive home, and someone needs to stay with them for 24 hours.  ONLY 1 SUPPORT PERSON MAY BE PRESENT WHILE YOU ARE IN SURGERY. IF YOU ARE TO BE ADMITTED ONCE YOU ARE IN YOUR ROOM YOU WILL BE ALLOWED TWO (2) VISITORS.  Minor children may have two parents present. Special consideration for safety and communication needs will be reviewed on a case by case basis.  Special instructions:    Oral Hygiene is also important to reduce your risk of infection.  Remember - BRUSH YOUR TEETH THE MORNING OF SURGERY WITH YOUR REGULAR TOOTHPASTE   [facility]- Preparing For Surgery  Before surgery, you can play an important role. Because skin is not sterile, your skin needs to be as free of germs as possible. You can reduce the number of germs on your skin by washing with CHG (chlorahexidine gluconate) Soap before surgery.  CHG is an antiseptic cleaner which kills germs and bonds with the skin to continue killing germs even after washing.     Please do not use if you have an allergy to CHG or antibacterial soaps. If your skin becomes reddened/irritated stop using the CHG.  Do not shave (including legs and underarms) for at least 48 hours prior to first CHG shower. It is OK to shave your face.  Please follow these instructions carefully.     Shower the NIGHT BEFORE SURGERY and the MORNING OF SURGERY with CHG Soap.   If you chose to wash your hair, wash your hair first as usual with your normal shampoo. After you shampoo, rinse your hair and body thoroughly to remove the shampoo.  Then [employer] and genitals (private parts) with your normal soap and rinse  thoroughly to remove soap.  After that Use CHG Soap as you would any other liquid soap. You can apply CHG directly to the skin and wash gently with a scrungie or a clean washcloth.   Apply the CHG Soap to your body ONLY FROM THE NECK DOWN.  Do not use on open wounds or open sores. Avoid contact with your eyes, ears, mouth and genitals (private parts). Wash Face and genitals (private parts)  with your normal soap.   Wash thoroughly, paying special attention to the area where your surgery will be performed.  Thoroughly rinse your body with warm water from the neck down.  DO NOT shower/wash with your normal soap after using and rinsing off the CHG Soap.  Pat yourself dry with a CLEAN TOWEL.  Wear CLEAN PAJAMAS to bed the night before surgery  Place CLEAN SHEETS on your bed the night before your surgery  DO NOT SLEEP WITH PETS.   Day of Surgery:  Take a shower with CHG soap. Wear Clean/Comfortable clothing the morning of surgery Do not apply any deodorants/lotions.   Remember to brush your teeth WITH YOUR REGULAR TOOTHPASTE.   Please read over the following fact sheets that you were given.

## 2021-01-27 ENCOUNTER — Encounter (HOSPITAL_COMMUNITY)
Admission: RE | Admit: 2021-01-27 | Discharge: 2021-01-27 | Disposition: A | Discharge status: Home / Self Care | Payer: 59 | Source: Ambulatory Visit | Attending: Neurosurgery | Admitting: Neurosurgery

## 2021-01-27 ENCOUNTER — Encounter (HOSPITAL_COMMUNITY): Payer: Self-pay

## 2021-01-27 ENCOUNTER — Other Ambulatory Visit: Payer: Self-pay

## 2021-01-27 DIAGNOSIS — Z01818 Encounter for other preprocedural examination: Secondary | ICD-10-CM | POA: Diagnosis not present

## 2021-01-27 HISTORY — DX: Other specified postprocedural states: R11.2

## 2021-01-27 HISTORY — DX: Fibromyalgia: M79.7

## 2021-01-27 HISTORY — DX: Other specified postprocedural states: Z98.890

## 2021-01-27 LAB — SURGICAL PCR SCREEN
MRSA, PCR: NEGATIVE
Staphylococcus aureus: NEGATIVE

## 2021-01-27 LAB — TYPE AND SCREEN
ABO/RH(D): B POS
Antibody Screen: NEGATIVE

## 2021-01-27 NOTE — Progress Notes (Signed)
PCP - Aram Beecham, MD Cardiologist - denies  PPM/ICD - denies Device Orders - N/A Rep Notified - N/A  Chest x-ray - N/A EKG - 01/27/2021 Stress Test - denies ECHO - denies Cardiac Cath - denies  Sleep Study - denies CPAP - N/A  Fasting Blood Sugar - N/A  Blood Thinner Instructions: N/A  Aspirin Instructions: patient was instructed: As of today, STOP taking any Aspirin (unless otherwise instructed by your surgeon) Aleve, Naproxen, Ibuprofen, Motrin, Advil, Goody's, BC's, all herbal medications, fish oil, and all vitamins.  ERAS Protcol - yes PRE-SURGERY Ensure or G2- No  COVID TEST- Patient was instructed to go to the COVID testing site on 01/30/2021   Anesthesia review: yes; anesthesia complication; labs in CE  Patient denies shortness of breath, fever, cough and chest pain at PAT appointment   All instructions explained to the patient, with a verbal understanding of the material. Patient agrees to go over the instructions while at home for a better understanding. Patient also instructed to self quarantine after being tested for COVID-19. The opportunity to ask questions was provided.

## 2021-01-27 NOTE — H&P (Signed)
Patient ID:   [id number] Patient: Laura Sanders  Date of Birth: 02/06/1951 Visit Type: Office Visit   Date: 11/24/2020 08:30 AM Provider: Danae Orleans. Venetia Maxon MD   This 70 year old female presents for back pain.  HISTORY OF PRESENT ILLNESS: 1.  back pain  The patient returns for recheck and to review her lumbar MRI  The patient is complaining of persistent back pain and bilateral lower extremity pain and notes soreness and difficulty with her balance.  She says she has been unable to sleep in her bed for the last 8 months.  She has bilateral EHL weakness at 4/5  Lumbar imaging demonstrates mobile spondylolisthesis with spinal stenosis at the L4-5 level  The patient has a normal bone density.  She notes a history of thrombocytopenia and says her platelet count has recently been around 100,000.  I have recommended that she seek evaluation at the [facility] with Hematology-Oncology to make sure there is no intervention necessary perioperatively due to her thrombocytopenia.  At this point the patient is very frustrated with her continued pain and weakness and wants to go ahead with surgery.      Medical/Surgical/Interim History Reviewed, no change.  Last detailed document date:09/17/2020.     PAST MEDICAL HISTORY, SURGICAL HISTORY, FAMILY HISTORY, SOCIAL HISTORY AND REVIEW OF SYSTEMS I have reviewed the patient's past medical, surgical, family and social history as well as the comprehensive review of systems as included on the [facility] history form dated 09/22/2020, which I have signed.  Family History: Reviewed, no changes.  Last detailed document date:09/17/2020.   Social History: Reviewed, no changes. Last detailed document date: 09/17/2020.    MEDICATIONS: (added, continued or stopped this visit) Started Medication Directions Instruction Stopped  alprazolam 0.5 mg tablet take 1 tablet by oral route 3 times every day    estradiol  2 mg tablet take 1 tablet by oral route  every day    montelukast 10 mg tablet take 1 tablet by oral route  every day in the evening  11/24/2020  trazodone 100 mg tablet take 1 tablet by oral route  every bedtime    venlafaxine 75 mg tablet take 1 tablet by oral route 2 times every day with food      ALLERGIES: Ingredient Reaction Medication Name Comment NO KNOWN ALLERGIES    NO KNOWN ALLERGIES    No known allergies. Reviewed, no changes.    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  History of thrombocytopenia  PLAN: Hematology/oncology evaluation for perioperative issues regarding thrombocytopenia.  Plan on decompression and fusion L4-5 level.  Patient was given a prescription for a lumbosacral orthosis and patient education around the surgery was performed in detail with her today.  Orders: Diagnostic Procedures: Assessment Procedure M54.16 Lumbar Spine- AP/Lat Miscellaneous: Assessment  M43.16 LSO Brace  Assessment/Plan  # Detail Type Description  1. Assessment Low back pain with right-sided sciatica, unspecified back pain laterality, unspecified chronicity (M54.41).     2. Assessment Degenerative lumbar spinal stenosis (M48.061).     3. Assessment Spondylolisthesis, lumbar region (M43.16).  Plan Orders LSO Brace.     4. Assessment Lumbar radiculopathy (M54.16).     5. Assessment Thrombocytopenia (D69.6).  Plan Orders Oncology Referral. Clinical information/comments: Hematology referral for surgical clearance and platelets  optimization before surgery.       Pain Management Plan Pain Scale: 3/10. Method: Numeric Pain Intensity Scale. Location: back. Onset: 09/17/2020. Duration: varies. Quality: burning. Pain  management follow-up plan of care: Patient will continue medication management..              Provider:  Danae Orleans. Venetia Maxon MD  11/28/2020 02:31 PM    Dictation edited by: Danae Orleans. Venetia Maxon    CC Providers: Osborne Oman [facility] [address] [place],  [place]  [address]-   Maeola Harman MD  [address] [place], [place] [address]               Electronically signed by Danae Orleans. Venetia Maxon MD on 11/28/2020 02:31 PM

## 2021-01-28 ENCOUNTER — Encounter (HOSPITAL_COMMUNITY): Payer: Self-pay

## 2021-01-28 NOTE — Anesthesia Preprocedure Evaluation (Addendum)
Anesthesia Evaluation  Patient identified by MRN, date of birth, ID band Patient awake    Reviewed: Allergy & Precautions, NPO status , Patient's Chart, lab work & pertinent test results  History of Anesthesia Complications (+) PONV and history of anesthetic complications  Airway Mallampati: II  TM Distance: >3 FB Neck ROM: Full    Dental no notable dental hx.    Pulmonary neg pulmonary ROS,    Pulmonary exam normal breath sounds clear to auscultation       Cardiovascular Exercise Tolerance: Good hypertension, Normal cardiovascular exam Rhythm:Regular Rate:Normal  Normal sinus rhythm Normal ECG No significant change since last tracing Confirmed by Nanetta Batty [phone]) on 01/28/2021 6:48:30 AM   Neuro/Psych  Headaches, PSYCHIATRIC DISORDERS Anxiety Depression    GI/Hepatic Neg liver ROS, PUD,   Endo/Other  negative endocrine ROS  Renal/GU negative Renal ROS     Musculoskeletal  (+) Arthritis , Fibromyalgia -  Abdominal   Peds negative pediatric ROS (+)  Hematology Chronic thrombocytopenia   Anesthesia Other Findings   Reproductive/Obstetrics                           Anesthesia Physical Anesthesia Plan  ASA: 2  Anesthesia Plan: General   Post-op Pain Management:    Induction: Intravenous  PONV Risk Score and Plan: 4 or greater and Treatment may vary due to age or medical condition, Scopolamine patch - Pre-op, Midazolam and Ondansetron  Airway Management Planned: Oral ETT  Additional Equipment: None  Intra-op Plan:   Post-operative Plan: Extubation in OR  Informed Consent: I have reviewed the patients History and Physical, chart, labs and discussed the procedure including the risks, benefits and alternatives for the proposed anesthesia with the patient or authorized representative who has indicated his/her understanding and acceptance.     Dental advisory given  Plan  Discussed with: CRNA, Anesthesiologist and Surgeon  Anesthesia Plan Comments: (CBC and CMP from 01/14/21 in care everywhere reviewed. Mild hypoNA with sodium 132, mild thrombocytopenia with platelets 100k, which is near pt's baseline, she has chronic thrombocytopenia followed by PCP. )       Anesthesia Quick Evaluation

## 2021-01-30 ENCOUNTER — Other Ambulatory Visit: Payer: Self-pay | Admitting: Neurosurgery

## 2021-01-30 LAB — SARS CORONAVIRUS 2 (TAT 6-24 HRS): SARS Coronavirus 2: NEGATIVE

## 2021-02-03 ENCOUNTER — Inpatient Hospital Stay (HOSPITAL_COMMUNITY): Payer: Medicare Other

## 2021-02-03 ENCOUNTER — Encounter (HOSPITAL_COMMUNITY)
Admission: RE | Disposition: A | Discharge status: Home / Self Care | Payer: Self-pay | Source: Ambulatory Visit | Attending: Neurosurgery

## 2021-02-03 ENCOUNTER — Inpatient Hospital Stay (HOSPITAL_COMMUNITY): Payer: Medicare Other | Admitting: Physician Assistant

## 2021-02-03 ENCOUNTER — Inpatient Hospital Stay (HOSPITAL_COMMUNITY)
Admission: RE | Admit: 2021-02-03 | Discharge: 2021-02-05 | DRG: 454 | Disposition: A | Discharge status: Home / Self Care | Payer: Medicare Other | Source: Ambulatory Visit | Attending: Neurosurgery | Admitting: Neurosurgery

## 2021-02-03 ENCOUNTER — Inpatient Hospital Stay (HOSPITAL_COMMUNITY): Payer: Medicare Other | Admitting: Anesthesiology

## 2021-02-03 ENCOUNTER — Other Ambulatory Visit: Payer: Self-pay

## 2021-02-03 ENCOUNTER — Encounter (HOSPITAL_COMMUNITY): Payer: Self-pay | Admitting: Neurosurgery

## 2021-02-03 DIAGNOSIS — M5416 Radiculopathy, lumbar region: Secondary | ICD-10-CM | POA: Diagnosis present

## 2021-02-03 DIAGNOSIS — M797 Fibromyalgia: Secondary | ICD-10-CM | POA: Diagnosis present

## 2021-02-03 DIAGNOSIS — Z79899 Other long term (current) drug therapy: Secondary | ICD-10-CM

## 2021-02-03 DIAGNOSIS — M4316 Spondylolisthesis, lumbar region: Secondary | ICD-10-CM | POA: Diagnosis present

## 2021-02-03 DIAGNOSIS — Z888 Allergy status to other drugs, medicaments and biological substances status: Secondary | ICD-10-CM | POA: Diagnosis not present

## 2021-02-03 DIAGNOSIS — Z79818 Long term (current) use of other agents affecting estrogen receptors and estrogen levels: Secondary | ICD-10-CM

## 2021-02-03 DIAGNOSIS — Z96651 Presence of right artificial knee joint: Secondary | ICD-10-CM | POA: Diagnosis present

## 2021-02-03 DIAGNOSIS — R45851 Suicidal ideations: Secondary | ICD-10-CM | POA: Diagnosis present

## 2021-02-03 DIAGNOSIS — F411 Generalized anxiety disorder: Secondary | ICD-10-CM

## 2021-02-03 DIAGNOSIS — F330 Major depressive disorder, recurrent, mild: Secondary | ICD-10-CM | POA: Diagnosis present

## 2021-02-03 DIAGNOSIS — Z419 Encounter for procedure for purposes other than remedying health state, unspecified: Secondary | ICD-10-CM

## 2021-02-03 DIAGNOSIS — I10 Essential (primary) hypertension: Secondary | ICD-10-CM | POA: Diagnosis present

## 2021-02-03 DIAGNOSIS — D696 Thrombocytopenia, unspecified: Secondary | ICD-10-CM | POA: Diagnosis present

## 2021-02-03 DIAGNOSIS — M48061 Spinal stenosis, lumbar region without neurogenic claudication: Secondary | ICD-10-CM | POA: Diagnosis present

## 2021-02-03 SURGERY — POSTERIOR LUMBAR FUSION 1 LEVEL
Anesthesia: General | Site: Back

## 2021-02-03 MED ORDER — BUPIVACAINE HCL (PF) 0.5 % IJ SOLN
INTRAMUSCULAR | Status: DC | PRN
  Administered 2021-02-03: 5 mL

## 2021-02-03 MED ORDER — HYDROXYZINE HCL 50 MG/ML IM SOLN: 50.0000 mg | Freq: Four times a day (QID) | INTRAMUSCULAR | Status: DC | PRN

## 2021-02-03 MED ORDER — ONDANSETRON HCL 4 MG/2ML IJ SOLN
4.0000 mg | Freq: Four times a day (QID) | INTRAMUSCULAR | Status: DC | PRN
Start: 2021-02-03 — End: 2021-02-03

## 2021-02-03 MED ORDER — BUPIVACAINE LIPOSOME 1.3 % IJ SUSP
INTRAMUSCULAR | Status: DC | PRN
  Administered 2021-02-03: 20 mL

## 2021-02-03 MED ORDER — ALUM & MAG HYDROXIDE-SIMETH 200-200-20 MG/5ML PO SUSP
30.0000 mL | Freq: Four times a day (QID) | ORAL | Status: DC | PRN
  Administered 2021-02-04: 30 mL via ORAL
  Filled 2021-02-03: qty 30

## 2021-02-03 MED ORDER — METHOCARBAMOL 1000 MG/10ML IJ SOLN
500.0000 mg | Freq: Four times a day (QID) | INTRAVENOUS | Status: DC | PRN
  Filled 2021-02-03: qty 5

## 2021-02-03 MED ORDER — DEXAMETHASONE SODIUM PHOSPHATE 10 MG/ML IJ SOLN
INTRAMUSCULAR | Status: AC
  Filled 2021-02-03: qty 1

## 2021-02-03 MED ORDER — PROPOFOL 10 MG/ML IV BOLUS
INTRAVENOUS | Status: AC
  Filled 2021-02-03: qty 20

## 2021-02-03 MED ORDER — SUGAMMADEX SODIUM 200 MG/2ML IV SOLN
INTRAVENOUS | Status: DC | PRN
  Administered 2021-02-03: 200 mg via INTRAVENOUS

## 2021-02-03 MED ORDER — VENLAFAXINE HCL ER 75 MG PO CP24
225.0000 mg | ORAL_CAPSULE | Freq: Every day | ORAL | Status: DC
  Administered 2021-02-04: 225 mg via ORAL
  Filled 2021-02-03: qty 3

## 2021-02-03 MED ORDER — PHENYLEPHRINE HCL-NACL 20-0.9 MG/250ML-% IV SOLN
INTRAVENOUS | Status: DC | PRN
Start: 2021-02-03 — End: 2021-02-03
  Administered 2021-02-03: 30 ug/min via INTRAVENOUS

## 2021-02-03 MED ORDER — ONDANSETRON HCL 4 MG PO TABS
4.0000 mg | ORAL_TABLET | Freq: Four times a day (QID) | ORAL | Status: DC | PRN
  Administered 2021-02-03: 4 mg via ORAL
  Filled 2021-02-03: qty 1

## 2021-02-03 MED ORDER — FENTANYL CITRATE (PF) 100 MCG/2ML IJ SOLN
INTRAMUSCULAR | Status: AC
  Filled 2021-02-03: qty 2

## 2021-02-03 MED ORDER — THROMBIN 5000 UNITS EX SOLR
CUTANEOUS | Status: AC
  Filled 2021-02-03: qty 5000

## 2021-02-03 MED ORDER — HYDROCODONE-ACETAMINOPHEN 5-325 MG PO TABS
1.0000 | ORAL_TABLET | ORAL | Status: DC | PRN
  Administered 2021-02-04 – 2021-02-05 (×3): 2 via ORAL
  Filled 2021-02-03 (×3): qty 2

## 2021-02-03 MED ORDER — KCL IN DEXTROSE-NACL 20-5-0.45 MEQ/L-%-% IV SOLN: INTRAVENOUS | Status: DC

## 2021-02-03 MED ORDER — MENTHOL 3 MG MT LOZG: 1.0000 | LOZENGE | OROMUCOSAL | Status: DC | PRN

## 2021-02-03 MED ORDER — FLEET ENEMA 7-19 GM/118ML RE ENEM: 1.0000 | ENEMA | Freq: Once | RECTAL | Status: DC | PRN

## 2021-02-03 MED ORDER — FENTANYL CITRATE (PF) 100 MCG/2ML IJ SOLN
25.0000 ug | INTRAMUSCULAR | Status: DC | PRN
  Administered 2021-02-03: 25 ug via INTRAVENOUS
  Administered 2021-02-03: 50 ug via INTRAVENOUS
  Administered 2021-02-03 (×3): 25 ug via INTRAVENOUS

## 2021-02-03 MED ORDER — PROMETHAZINE HCL 25 MG/ML IJ SOLN: 6.2500 mg | INTRAMUSCULAR | Status: DC | PRN

## 2021-02-03 MED ORDER — METHOCARBAMOL 500 MG PO TABS
500.0000 mg | ORAL_TABLET | Freq: Four times a day (QID) | ORAL | Status: DC | PRN
  Administered 2021-02-03 – 2021-02-05 (×5): 500 mg via ORAL
  Filled 2021-02-03 (×5): qty 1

## 2021-02-03 MED ORDER — ACETAMINOPHEN 650 MG RE SUPP: 650.0000 mg | RECTAL | Status: DC | PRN

## 2021-02-03 MED ORDER — OXYCODONE HCL 5 MG/5ML PO SOLN
5.0000 mg | Freq: Once | ORAL | Status: AC | PRN
Start: 2021-02-03 — End: 2021-02-03

## 2021-02-03 MED ORDER — LIDOCAINE-EPINEPHRINE 1 %-1:100000 IJ SOLN
INTRAMUSCULAR | Status: AC
  Filled 2021-02-03: qty 1

## 2021-02-03 MED ORDER — PROPOFOL 10 MG/ML IV BOLUS
INTRAVENOUS | Status: DC | PRN
  Administered 2021-02-03: 150 mg via INTRAVENOUS

## 2021-02-03 MED ORDER — ACETAMINOPHEN 500 MG PO TABS
1000.0000 mg | ORAL_TABLET | Freq: Once | ORAL | Status: AC
  Administered 2021-02-03: 1000 mg via ORAL
  Filled 2021-02-03: qty 2

## 2021-02-03 MED ORDER — POLYETHYLENE GLYCOL 3350 17 G PO PACK: 17.0000 g | PACK | Freq: Every day | ORAL | Status: DC | PRN

## 2021-02-03 MED ORDER — ORAL CARE MOUTH RINSE: 15.0000 mL | Freq: Once | OROMUCOSAL | Status: AC

## 2021-02-03 MED ORDER — BUPIVACAINE HCL (PF) 0.5 % IJ SOLN
INTRAMUSCULAR | Status: AC
  Filled 2021-02-03: qty 30

## 2021-02-03 MED ORDER — OXYCODONE HCL 5 MG/5ML PO SOLN: 5.0000 mg | Freq: Once | ORAL | Status: DC | PRN

## 2021-02-03 MED ORDER — CHLORHEXIDINE GLUCONATE CLOTH 2 % EX PADS: 6.0000 | MEDICATED_PAD | Freq: Once | CUTANEOUS | Status: DC

## 2021-02-03 MED ORDER — 0.9 % SODIUM CHLORIDE (POUR BTL) OPTIME
TOPICAL | Status: DC | PRN
  Administered 2021-02-03: 1000 mL

## 2021-02-03 MED ORDER — FENTANYL CITRATE (PF) 250 MCG/5ML IJ SOLN
INTRAMUSCULAR | Status: AC
  Filled 2021-02-03: qty 5

## 2021-02-03 MED ORDER — MIDAZOLAM HCL 5 MG/5ML IJ SOLN
INTRAMUSCULAR | Status: DC | PRN
  Administered 2021-02-03: 2 mg via INTRAVENOUS

## 2021-02-03 MED ORDER — OXYCODONE HCL 5 MG PO TABS
5.0000 mg | ORAL_TABLET | Freq: Once | ORAL | Status: AC | PRN
  Administered 2021-02-03: 5 mg via ORAL

## 2021-02-03 MED ORDER — ROCURONIUM BROMIDE 10 MG/ML (PF) SYRINGE
PREFILLED_SYRINGE | INTRAVENOUS | Status: AC
  Filled 2021-02-03: qty 10

## 2021-02-03 MED ORDER — SODIUM CHLORIDE 0.9% FLUSH: 3.0000 mL | INTRAVENOUS | Status: DC | PRN

## 2021-02-03 MED ORDER — SCOPOLAMINE 1 MG/3DAYS TD PT72
1.0000 | MEDICATED_PATCH | TRANSDERMAL | Status: DC
  Administered 2021-02-03: 1.5 mg via TRANSDERMAL
  Filled 2021-02-03: qty 1

## 2021-02-03 MED ORDER — CHLORHEXIDINE GLUCONATE 0.12 % MT SOLN
15.0000 mL | Freq: Once | OROMUCOSAL | Status: AC
  Administered 2021-02-03: 15 mL via OROMUCOSAL
  Filled 2021-02-03: qty 15

## 2021-02-03 MED ORDER — MIDAZOLAM HCL 2 MG/2ML IJ SOLN
INTRAMUSCULAR | Status: AC
  Filled 2021-02-03: qty 2

## 2021-02-03 MED ORDER — SODIUM CHLORIDE 0.9% FLUSH
3.0000 mL | Freq: Two times a day (BID) | INTRAVENOUS | Status: DC
  Administered 2021-02-03: 3 mL via INTRAVENOUS

## 2021-02-03 MED ORDER — OXYCODONE HCL 5 MG PO TABS
5.0000 mg | ORAL_TABLET | ORAL | Status: DC | PRN
Start: 2021-02-03 — End: 2021-02-05
  Administered 2021-02-03 – 2021-02-04 (×5): 10 mg via ORAL
  Filled 2021-02-03 (×6): qty 2

## 2021-02-03 MED ORDER — LIDOCAINE 2% (20 MG/ML) 5 ML SYRINGE
INTRAMUSCULAR | Status: AC
  Filled 2021-02-03: qty 5

## 2021-02-03 MED ORDER — PHENOL 1.4 % MT LIQD: 1.0000 | OROMUCOSAL | Status: DC | PRN

## 2021-02-03 MED ORDER — LORATADINE 10 MG PO TABS
10.0000 mg | ORAL_TABLET | Freq: Every day | ORAL | Status: DC
Start: 2021-02-04 — End: 2021-02-05
  Administered 2021-02-04: 10 mg via ORAL
  Filled 2021-02-03: qty 1

## 2021-02-03 MED ORDER — ONDANSETRON HCL 4 MG/2ML IJ SOLN: 4.0000 mg | Freq: Four times a day (QID) | INTRAMUSCULAR | Status: DC | PRN

## 2021-02-03 MED ORDER — PANTOPRAZOLE SODIUM 40 MG IV SOLR
40.0000 mg | Freq: Every day | INTRAVENOUS | Status: DC
  Administered 2021-02-03: 40 mg via INTRAVENOUS
  Filled 2021-02-03: qty 40

## 2021-02-03 MED ORDER — OXYCODONE HCL 5 MG PO TABS: 5.0000 mg | ORAL_TABLET | Freq: Once | ORAL | Status: DC | PRN

## 2021-02-03 MED ORDER — FENTANYL CITRATE (PF) 250 MCG/5ML IJ SOLN
INTRAMUSCULAR | Status: DC | PRN
  Administered 2021-02-03 (×3): 50 ug via INTRAVENOUS
  Administered 2021-02-03: 100 ug via INTRAVENOUS

## 2021-02-03 MED ORDER — LIDOCAINE-EPINEPHRINE 1 %-1:100000 IJ SOLN
INTRAMUSCULAR | Status: DC | PRN
  Administered 2021-02-03: 5 mL

## 2021-02-03 MED ORDER — ACETAMINOPHEN 325 MG PO TABS: 650.0000 mg | ORAL_TABLET | ORAL | Status: DC | PRN

## 2021-02-03 MED ORDER — KETOROLAC TROMETHAMINE 15 MG/ML IJ SOLN
7.5000 mg | Freq: Four times a day (QID) | INTRAMUSCULAR | Status: AC
  Administered 2021-02-03 – 2021-02-04 (×3): 7.5 mg via INTRAVENOUS
  Filled 2021-02-03 (×3): qty 1

## 2021-02-03 MED ORDER — ESTRADIOL 1 MG PO TABS
2.0000 mg | ORAL_TABLET | Freq: Every day | ORAL | Status: DC
  Administered 2021-02-04: 2 mg via ORAL
  Filled 2021-02-03 (×2): qty 2
  Filled 2021-02-03: qty 1

## 2021-02-03 MED ORDER — BISOPROLOL-HYDROCHLOROTHIAZIDE 5-6.25 MG PO TABS
1.0000 | ORAL_TABLET | Freq: Two times a day (BID) | ORAL | Status: DC
  Administered 2021-02-04: 1 via ORAL
  Filled 2021-02-03 (×5): qty 1

## 2021-02-03 MED ORDER — OXYCODONE HCL 5 MG PO TABS
ORAL_TABLET | ORAL | Status: AC
  Filled 2021-02-03: qty 1

## 2021-02-03 MED ORDER — ONDANSETRON HCL 4 MG/2ML IJ SOLN
INTRAMUSCULAR | Status: DC | PRN
  Administered 2021-02-03: 4 mg via INTRAVENOUS

## 2021-02-03 MED ORDER — PANTOPRAZOLE SODIUM 40 MG PO TBEC: 40.0000 mg | DELAYED_RELEASE_TABLET | Freq: Two times a day (BID) | ORAL | Status: DC

## 2021-02-03 MED ORDER — ALPRAZOLAM 0.5 MG PO TABS
1.0000 mg | ORAL_TABLET | Freq: Two times a day (BID) | ORAL | Status: DC | PRN
  Administered 2021-02-03 – 2021-02-04 (×2): 1 mg via ORAL
  Filled 2021-02-03 (×2): qty 2

## 2021-02-03 MED ORDER — HYDROMORPHONE HCL 1 MG/ML IJ SOLN: 0.5000 mg | INTRAMUSCULAR | Status: DC | PRN

## 2021-02-03 MED ORDER — ROCURONIUM BROMIDE 10 MG/ML (PF) SYRINGE
PREFILLED_SYRINGE | INTRAVENOUS | Status: DC | PRN
  Administered 2021-02-03: 70 mg via INTRAVENOUS
  Administered 2021-02-03: 30 mg via INTRAVENOUS

## 2021-02-03 MED ORDER — BISACODYL 10 MG RE SUPP: 10.0000 mg | Freq: Every day | RECTAL | Status: DC | PRN

## 2021-02-03 MED ORDER — TRAZODONE HCL 50 MG PO TABS
100.0000 mg | ORAL_TABLET | Freq: Every day | ORAL | Status: DC
  Administered 2021-02-03 – 2021-02-04 (×2): 100 mg via ORAL
  Filled 2021-02-03 (×2): qty 2

## 2021-02-03 MED ORDER — THROMBIN 5000 UNITS EX SOLR
OROMUCOSAL | Status: DC | PRN
  Administered 2021-02-03: 5 mL via TOPICAL

## 2021-02-03 MED ORDER — LIDOCAINE 2% (20 MG/ML) 5 ML SYRINGE
INTRAMUSCULAR | Status: DC | PRN
  Administered 2021-02-03: 70 mg via INTRAVENOUS

## 2021-02-03 MED ORDER — CEFAZOLIN SODIUM-DEXTROSE 2-4 GM/100ML-% IV SOLN
2.0000 g | Freq: Three times a day (TID) | INTRAVENOUS | Status: AC
  Administered 2021-02-03 – 2021-02-04 (×2): 2 g via INTRAVENOUS
  Filled 2021-02-03 (×2): qty 100

## 2021-02-03 MED ORDER — SODIUM CHLORIDE 0.9 % IV SOLN: 250.0000 mL | INTRAVENOUS | Status: DC

## 2021-02-03 MED ORDER — DEXAMETHASONE SODIUM PHOSPHATE 10 MG/ML IJ SOLN
INTRAMUSCULAR | Status: DC | PRN
  Administered 2021-02-03: 10 mg via INTRAVENOUS

## 2021-02-03 MED ORDER — CEFAZOLIN SODIUM-DEXTROSE 2-4 GM/100ML-% IV SOLN
2.0000 g | INTRAVENOUS | Status: AC
  Administered 2021-02-03: 2 g via INTRAVENOUS
  Filled 2021-02-03: qty 100

## 2021-02-03 MED ORDER — LACTATED RINGERS IV SOLN: INTRAVENOUS | Status: DC

## 2021-02-03 MED ORDER — ZOLPIDEM TARTRATE 5 MG PO TABS: 5.0000 mg | ORAL_TABLET | Freq: Every evening | ORAL | Status: DC | PRN

## 2021-02-03 MED ORDER — VITAMIN D 25 MCG (1000 UNIT) PO TABS
2000.0000 [IU] | ORAL_TABLET | Freq: Every day | ORAL | Status: DC
  Administered 2021-02-04: 2000 [IU] via ORAL
  Filled 2021-02-03: qty 2

## 2021-02-03 MED ORDER — BUPIVACAINE LIPOSOME 1.3 % IJ SUSP
INTRAMUSCULAR | Status: AC
  Filled 2021-02-03: qty 20

## 2021-02-03 MED ORDER — ACETAMINOPHEN 160 MG/5ML PO ELIX: 480.0000 mg | ORAL_SOLUTION | Freq: Four times a day (QID) | ORAL | Status: DC | PRN

## 2021-02-03 MED ORDER — AMISULPRIDE (ANTIEMETIC) 5 MG/2ML IV SOLN: 10.0000 mg | Freq: Once | INTRAVENOUS | Status: DC | PRN

## 2021-02-03 MED ORDER — DOCUSATE SODIUM 100 MG PO CAPS
100.0000 mg | ORAL_CAPSULE | Freq: Two times a day (BID) | ORAL | Status: DC
  Administered 2021-02-03 – 2021-02-04 (×4): 100 mg via ORAL
  Filled 2021-02-03 (×4): qty 1

## 2021-02-03 SURGICAL SUPPLY — 80 items
ADH SKN CLS APL DERMABOND .7 (GAUZE/BANDAGES/DRESSINGS) ×2
BAG COUNTER SPONGE SURGICOUNT (BAG) ×4 IMPLANT
BAG SPNG CNTER NS LX DISP (BAG) ×4
BAND RUBBER #7 7IN GRN STRL LF (MISCELLANEOUS) ×1 IMPLANT
BASKET BONE COLLECTION (BASKET) ×3 IMPLANT
BLADE CLIPPER SURG (BLADE) IMPLANT
BUR MATCHSTICK NEURO 3.0 LAGG (BURR) ×3 IMPLANT
BUR PRECISION FLUTE 5.0 (BURR) ×3 IMPLANT
CAGE PLIF 8X9X23-12 LUMBAR (Cage) ×2 IMPLANT
CANISTER SUCT 3000ML PPV (MISCELLANEOUS) ×3 IMPLANT
CARTRIDGE OIL MAESTRO DRILL (MISCELLANEOUS) ×2 IMPLANT
CNTNR URN SCR LID CUP LEK RST (MISCELLANEOUS) ×2 IMPLANT
CONT SPEC 4OZ STRL OR WHT (MISCELLANEOUS) ×3
COVER BACK TABLE 60X90IN (DRAPES) ×3 IMPLANT
COVER TRANSDUCER ULTRASND 9X24 (MISCELLANEOUS) ×1 IMPLANT
DECANTER SPIKE VIAL GLASS SM (MISCELLANEOUS) ×3 IMPLANT
DERMABOND ADVANCED (GAUZE/BANDAGES/DRESSINGS) ×1
DERMABOND ADVANCED .7 DNX12 (GAUZE/BANDAGES/DRESSINGS) ×2 IMPLANT
DIFFUSER DRILL AIR PNEUMATIC (MISCELLANEOUS) ×3 IMPLANT
DRAPE C-ARM 42X72 X-RAY (DRAPES) ×3 IMPLANT
DRAPE C-ARMOR (DRAPES) ×3 IMPLANT
DRAPE LAPAROTOMY 100X72X124 (DRAPES) ×3 IMPLANT
DRAPE SURG 17X23 STRL (DRAPES) ×3 IMPLANT
DRSG OPSITE POSTOP 4X6 (GAUZE/BANDAGES/DRESSINGS) ×1 IMPLANT
DURAPREP 26ML APPLICATOR (WOUND CARE) ×3 IMPLANT
ELECT BLADE 4.0 EZ CLEAN MEGAD (MISCELLANEOUS) ×3
ELECT REM PT RETURN 9FT ADLT (ELECTROSURGICAL) ×3
ELECTRODE BLDE 4.0 EZ CLN MEGD (MISCELLANEOUS) IMPLANT
ELECTRODE REM PT RTRN 9FT ADLT (ELECTROSURGICAL) ×2 IMPLANT
EVACUATOR 1/8 PVC DRAIN (DRAIN) IMPLANT
GAUZE 4X4 16PLY ~~LOC~~+RFID DBL (SPONGE) IMPLANT
GAUZE SPONGE 4X4 12PLY STRL (GAUZE/BANDAGES/DRESSINGS) ×3 IMPLANT
GLOVE EXAM NITRILE XL STR (GLOVE) IMPLANT
GLOVE SRG 8 PF TXTR STRL LF DI (GLOVE) ×4 IMPLANT
GLOVE SURG ENC MOIS LTX SZ8 (GLOVE) ×6 IMPLANT
GLOVE SURG LTX SZ8 (GLOVE) ×6 IMPLANT
GLOVE SURG POLYISO LF SZ7.5 (GLOVE) ×1 IMPLANT
GLOVE SURG UNDER POLY LF SZ8 (GLOVE) ×6
GLOVE SURG UNDER POLY LF SZ8.5 (GLOVE) ×7 IMPLANT
GOWN STRL REUS W/ TWL LRG LVL3 (GOWN DISPOSABLE) IMPLANT
GOWN STRL REUS W/ TWL XL LVL3 (GOWN DISPOSABLE) ×4 IMPLANT
GOWN STRL REUS W/TWL 2XL LVL3 (GOWN DISPOSABLE) ×6 IMPLANT
GOWN STRL REUS W/TWL LRG LVL3 (GOWN DISPOSABLE) ×3
GOWN STRL REUS W/TWL XL LVL3 (GOWN DISPOSABLE) ×6
HEMOSTAT POWDER KIT SURGIFOAM (HEMOSTASIS) ×3 IMPLANT
KIT BASIN OR (CUSTOM PROCEDURE TRAY) ×3 IMPLANT
KIT INFUSE XX SMALL 0.7CC (Orthopedic Implant) ×1 IMPLANT
KIT POSITION SURG JACKSON T1 (MISCELLANEOUS) ×3 IMPLANT
KIT TURNOVER KIT B (KITS) ×3 IMPLANT
MILL MEDIUM DISP (BLADE) ×1 IMPLANT
NDL HYPO 21X1.5 SAFETY (NEEDLE) IMPLANT
NDL HYPO 25X1 1.5 SAFETY (NEEDLE) ×2 IMPLANT
NDL SPNL 18GX3.5 QUINCKE PK (NEEDLE) IMPLANT
NEEDLE HYPO 21X1.5 SAFETY (NEEDLE) ×3 IMPLANT
NEEDLE HYPO 25X1 1.5 SAFETY (NEEDLE) ×3 IMPLANT
NEEDLE SPNL 18GX3.5 QUINCKE PK (NEEDLE) IMPLANT
NS IRRIG 1000ML POUR BTL (IV SOLUTION) ×3 IMPLANT
OIL CARTRIDGE MAESTRO DRILL (MISCELLANEOUS) ×3
PACK LAMINECTOMY NEURO (CUSTOM PROCEDURE TRAY) ×3 IMPLANT
PAD ARMBOARD 7.5X6 YLW CONV (MISCELLANEOUS) ×6 IMPLANT
PATTIES SURGICAL .5 X.5 (GAUZE/BANDAGES/DRESSINGS) IMPLANT
PATTIES SURGICAL .5 X1 (DISPOSABLE) IMPLANT
PATTIES SURGICAL 1X1 (DISPOSABLE) IMPLANT
ROD RELINE LORDOTIC 5.5X45 (Rod) ×2 IMPLANT
SCREW LOCK RELINE 5.5 TULIP (Screw) ×4 IMPLANT
SCREW RELINE-O POLY 6.5X40 (Screw) ×2 IMPLANT
SCREW RELINE-O POLY 6.5X45 (Screw) ×2 IMPLANT
SPONGE SURGIFOAM ABS GEL 100 (HEMOSTASIS) IMPLANT
SPONGE T-LAP 4X18 ~~LOC~~+RFID (SPONGE) ×2 IMPLANT
STAPLER SKIN PROX WIDE 3.9 (STAPLE) ×1 IMPLANT
SUT VIC AB 1 CT1 18XBRD ANBCTR (SUTURE) ×4 IMPLANT
SUT VIC AB 1 CT1 8-18 (SUTURE) ×3
SUT VIC AB 2-0 CT1 18 (SUTURE) ×6 IMPLANT
SUT VIC AB 3-0 SH 8-18 (SUTURE) ×6 IMPLANT
SYR 20ML LL LF (SYRINGE) ×1 IMPLANT
SYR 5ML LL (SYRINGE) IMPLANT
TOWEL GREEN STERILE (TOWEL DISPOSABLE) ×3 IMPLANT
TOWEL GREEN STERILE FF (TOWEL DISPOSABLE) ×3 IMPLANT
TRAY FOLEY MTR SLVR 16FR STAT (SET/KITS/TRAYS/PACK) ×3 IMPLANT
WATER STERILE IRR 1000ML POUR (IV SOLUTION) ×3 IMPLANT

## 2021-02-03 NOTE — Op Note (Signed)
02/03/2021  12:20 PM  PATIENT:  Laura Sanders  70 y.o. female  PRE-OPERATIVE DIAGNOSIS:  Spondylolisthesis, Lumbar region, degenerative lumbar spinal stenosis, lumbago, radiculopathy L 45 level  POST-OPERATIVE DIAGNOSIS: Spondylolisthesis, Lumbar region, degenerative lumbar spinal stenosis, lumbago, radiculopathy L 45 level  PROCEDURE:  Procedure(s): Lumbar four-five Decompression/ lumbar interbody fusion (N/A) APPLICATION OF PULSE with PEEK cages, autograft, pedicle screw fixation, posterolateral arthrodesis  SURGEON:  Surgeon(s) and Role:    Maeola Harman, MD - Primary  PHYSICIAN ASSISTANT: Julien Girt, NP  ASSISTANTS: Poteat, RN   ANESTHESIA:   general  EBL:  50 mL   BLOOD ADMINISTERED:none  DRAINS: none   LOCAL MEDICATIONS USED:  MARCAINE    and LIDOCAINE   SPECIMEN:  No Specimen  DISPOSITION OF SPECIMEN:  N/A  COUNTS:  YES  TOURNIQUET:  * No tourniquets in log *  DICTATION: Patient is 70 year old woman with mobile spondylolisthesis of L4 on L5 with lumbar stenosis. She has a severe right L 5  radiculopathy. It was elected to take her to surgery for decompression and fusion at this level. Procedure: Patient was placed in a prone position on the Baron table after smooth and uncomplicated induction of general endotracheal anesthesia. Her low back was prepped and draped in usual sterile fashion with betadine scrub and DuraPrep. Pulse was used to mark relevant bony anatomy.  Area of incision was infiltrated with local lidocaine. Incision was made to the lumbodorsal fascia was incised and exposure was performed of the L4 through L5 spinous processes laminae facet joint and transverse processes. Intraoperative x-ray was obtained which confirmed correct orientation. A total laminectomy of L4 was performed with disarticulation of the facet joints at this level and thorough decompression was performed of both L4 and L5 nerve roots along with the common dural tube. This decompression  was more involved than would be typical of that performed for PLIF alone and included painstaking dissection of adherent ligament compressing the thecal sac and wide decompression of all neural elements. A thorough discectomy was initially performed on the left with preparation of the endplates for grafting a trial spacer was placed this level and a thorough discectomy was performed on the right as well.  Bilateral 8 x 9 x 23 mm x 12 degree peek cages were packed with extra extra small BMP and and was inserted the interspace and countersunk appropriately along with 8 cc of morselized bone autograft. The posterolateral region was extensively decorticated and pedicle probes were placed at L4 and L5 bilaterally. Intraoperative fluoroscopy confirmed correct orientationin the AP and lateral plane. 40 x 6.5 mm pedicle screws were placed at L5 bilaterally and 45 x 6.5 mm screws placed at L4 bilaterally final x-rays demonstrated well-positioned interbody grafts and pedicle screw fixation. A 45 mm lordotic rod was placed on the right and a 45 mm rod was placed on the left locked down in situ and the posterolateral region was packed on the right with the remaining 15 cc autograft. The wound was irrigated and long-acting Marcaine was injected in the deep musculature. Fascia was closed with 1 Vicryl sutures skin edges were reapproximated 2 and 3-0 Vicryl sutures. The wound is dressed with Dermabond and an occlusive dressing.   the patient was extubated in the operating room and taken to recovery in stable satisfactory condition. She tolerated the operation well counts were correct at the end of the case.   PLAN OF CARE: Admit to inpatient   PATIENT DISPOSITION:  PACU - hemodynamically stable.  Delay start of Pharmacological VTE agent (>24hrs) due to surgical blood loss or risk of bleeding: yes

## 2021-02-03 NOTE — Brief Op Note (Signed)
02/03/2021  12:20 PM  PATIENT:  Laura Sanders  70 y.o. female  PRE-OPERATIVE DIAGNOSIS:  Spondylolisthesis, Lumbar region, degenerative lumbar spinal stenosis, lumbago, radiculopathy L 45 level  POST-OPERATIVE DIAGNOSIS: Spondylolisthesis, Lumbar region, degenerative lumbar spinal stenosis, lumbago, radiculopathy L 45 level  PROCEDURE:  Procedure(s): Lumbar four-five Decompression/ lumbar interbody fusion (N/A) APPLICATION OF PULSE with PEEK cages, autograft, pedicle screw fixation, posterolateral arthrodesis  SURGEON:  Surgeon(s) and Role:    * Jillene Wehrenberg, MD - Primary  PHYSICIAN ASSISTANT: McDaniel, NP  ASSISTANTS: Poteat, RN   ANESTHESIA:   general  EBL:  50 mL   BLOOD ADMINISTERED:none  DRAINS: none   LOCAL MEDICATIONS USED:  MARCAINE    and LIDOCAINE   SPECIMEN:  No Specimen  DISPOSITION OF SPECIMEN:  N/A  COUNTS:  YES  TOURNIQUET:  * No tourniquets in log *  DICTATION: Patient is 70-year-old woman with mobile spondylolisthesis of L4 on L5 with lumbar stenosis. She has a severe right L 5  radiculopathy. It was elected to take her to surgery for decompression and fusion at this level. Procedure: Patient was placed in a prone position on the Jackson table after smooth and uncomplicated induction of general endotracheal anesthesia. Her low back was prepped and draped in usual sterile fashion with betadine scrub and DuraPrep. Pulse was used to mark relevant bony anatomy.  Area of incision was infiltrated with local lidocaine. Incision was made to the lumbodorsal fascia was incised and exposure was performed of the L4 through L5 spinous processes laminae facet joint and transverse processes. Intraoperative x-ray was obtained which confirmed correct orientation. A total laminectomy of L4 was performed with disarticulation of the facet joints at this level and thorough decompression was performed of both L4 and L5 nerve roots along with the common dural tube. This decompression  was more involved than would be typical of that performed for PLIF alone and included painstaking dissection of adherent ligament compressing the thecal sac and wide decompression of all neural elements. A thorough discectomy was initially performed on the left with preparation of the endplates for grafting a trial spacer was placed this level and a thorough discectomy was performed on the right as well.  Bilateral 8 x 9 x 23 mm x 12 degree peek cages were packed with extra extra small BMP and and was inserted the interspace and countersunk appropriately along with 8 cc of morselized bone autograft. The posterolateral region was extensively decorticated and pedicle probes were placed at L4 and L5 bilaterally. Intraoperative fluoroscopy confirmed correct orientationin the AP and lateral plane. 40 x 6.5 mm pedicle screws were placed at L5 bilaterally and 45 x 6.5 mm screws placed at L4 bilaterally final x-rays demonstrated well-positioned interbody grafts and pedicle screw fixation. A 45 mm lordotic rod was placed on the right and a 45 mm rod was placed on the left locked down in situ and the posterolateral region was packed on the right with the remaining 15 cc autograft. The wound was irrigated and long-acting Marcaine was injected in the deep musculature. Fascia was closed with 1 Vicryl sutures skin edges were reapproximated 2 and 3-0 Vicryl sutures. The wound is dressed with Dermabond and an occlusive dressing.   the patient was extubated in the operating room and taken to recovery in stable satisfactory condition. She tolerated the operation well counts were correct at the end of the case.   PLAN OF CARE: Admit to inpatient   PATIENT DISPOSITION:  PACU - hemodynamically stable.     Delay start of Pharmacological VTE agent (>24hrs) due to surgical blood loss or risk of bleeding: yes

## 2021-02-03 NOTE — Progress Notes (Signed)
Orthopedic Tech Progress Note Patient Details:  Laura Sanders 01-26-1951 [id number]  Patient has BRACE   Patient ID: Laura Sanders, female   DOB: 03-07-1951, 70 y.o.   MRN: [id number]  Laura Sanders 02/03/2021, 2:36 PM

## 2021-02-03 NOTE — Interval H&P Note (Signed)
History and Physical Interval Note:  02/03/2021 8:56 AM  Laura Sanders  has presented today for surgery, with the diagnosis of Spondylolisthesis, Lumbar region.  The various methods of treatment have been discussed with the patient and family. After consideration of risks, benefits and other options for treatment, the patient has consented to  Procedure(s) with comments: Lumbar 4-5 Decompression/Fusion (N/A) - 3C/RM 21 as a surgical intervention.  The patient's history has been reviewed, patient examined, no change in status, stable for surgery.  I have reviewed the patient's chart and labs.  Questions were answered to the patient's satisfaction.     Dorian Heckle

## 2021-02-03 NOTE — Progress Notes (Signed)
Patient is awake, sleepy, but arousable.  Sore in back.  MAEW with full power.  Patient is doing well.

## 2021-02-03 NOTE — Transfer of Care (Signed)
Immediate Anesthesia Transfer of Care Note  Patient: Laura Sanders  Procedure(s) Performed: Lumbar four-five Decompression/ lumbar interbody fusion (Back) APPLICATION OF PULSE  Patient Location: PACU  Anesthesia Type:General  Level of Consciousness: sedated, patient cooperative and responds to stimulation  Airway & Oxygen Therapy: Patient Spontanous Breathing and Patient connected to face mask oxygen  Post-op Assessment: Report given to RN, Post -op Vital signs reviewed and stable and Patient moving all extremities  Post vital signs: Reviewed and stable  Last Vitals:  Vitals Value Taken Time  BP 102/54 02/03/21 1231  Temp    Pulse 58 02/03/21 1234  Resp 14 02/03/21 1234  SpO2 100 % 02/03/21 1234  Vitals shown include unvalidated device data.  Last Pain:  Vitals:   02/03/21 0830  TempSrc:   PainSc: 0-No pain         Complications: No notable events documented.

## 2021-02-03 NOTE — Anesthesia Procedure Notes (Signed)
Procedure Name: Intubation Date/Time: 02/03/2021 9:42 AM Performed by: Myna Bright, CRNA Pre-anesthesia Checklist: Patient identified, Emergency Drugs available, Suction available and Patient being monitored Patient Re-evaluated:Patient Re-evaluated prior to induction Oxygen Delivery Method: Circle system utilized Preoxygenation: Pre-oxygenation with 100% oxygen Induction Type: IV induction Ventilation: Mask ventilation without difficulty Laryngoscope Size: Mac and 3 Grade View: Grade II Tube type: Oral Tube size: 7.5 mm Number of attempts: 1 Airway Equipment and Method: Stylet Placement Confirmation: ETT inserted through vocal cords under direct vision, positive ETCO2 and breath sounds checked- equal and bilateral Secured at: 21 cm Tube secured with: Tape Dental Injury: Teeth and Oropharynx as per pre-operative assessment

## 2021-02-04 DIAGNOSIS — F411 Generalized anxiety disorder: Secondary | ICD-10-CM | POA: Diagnosis not present

## 2021-02-04 DIAGNOSIS — F330 Major depressive disorder, recurrent, mild: Secondary | ICD-10-CM

## 2021-02-04 MED ORDER — PANTOPRAZOLE SODIUM 40 MG PO TBEC
40.0000 mg | DELAYED_RELEASE_TABLET | Freq: Every day | ORAL | Status: DC
  Administered 2021-02-04: 40 mg via ORAL
  Filled 2021-02-04: qty 1
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
## 2021-02-04 NOTE — Progress Notes (Signed)
Subjective: Patient reports that she is recovering well and is pleased with her postoperative status. She did report that she is feeling depressed and stated "I would have been ok if I didn't wake up from my surgery yesterday." She denied thoughts of harming herself or others. She further reported difficult family dynamics at home and that she doesn't feel as though she can go home at this time.    Objective: Vital signs in last 24 hours: Temp:  [97.6 F (36.4 C)-98 F (36.7 C)] 97.6 F (36.4 C) (08/24 0736) Pulse Rate:  [55-70] 68 (08/24 0736) Resp:  [10-19] 18 (08/24 0736) BP: (92-121)/(54-67) 102/57 (08/24 0736) SpO2:  [96 %-100 %] 98 % (08/24 0736)  Intake/Output from previous day: 08/23 0701 - 08/24 0700 In: 1860 [P.O.:360; I.V.:1500] Out: 725 [Urine:675; Blood:50] Intake/Output this shift: No intake/output data recorded.  Physical Exam: Patient is awake, A/O X 4. She has a flat affect. They are in NAD and VSS. Speech is fluent and appropriate. MAEW with good strength that is symmetric bilaterally. Sensation to light touch is intact. PERLA, EOMI. CNs grossly intact. Dressing is clean dry intact. Incision is well approximated with no drainage, erythema, or edema.    Lab Results: No results for input(s): WBC, HGB, HCT, PLT in the last 72 hours. BMET No results for input(s): NA, K, CL, CO2, GLUCOSE, BUN, CREATININE, CALCIUM in the last 72 hours.  Studies/Results: DG Lumbar Spine 2-3 Views  Result Date: 02/03/2021 CLINICAL DATA:  L4-5 decompression/lumbar interbody fusion. EXAM: LUMBAR SPINE - 2-3 VIEW; DG C-ARM 1-60 MIN-NO REPORT COMPARISON:  Previous lumbar spine evaluation from September 17, 2020. FINDINGS: Three intraoperative spot radiographs are provided. Lateral projection shows interval placement of pedicle screws at the L4-5 level. Next frontal view shows similar findings. Interbody cage placement is noted with signs of laminectomy on frontal view. Last image with similar  findings of pedicle screws and interbody cage placement. Slightly improved anterolisthesis of L4 on L5 following interbody cage placement. IMPRESSION: Interbody spot radiographs, 3 total images are submitted with no immediate complication. Signs of laminectomy, bilateral L4-5 pedicle screw placement and interbody cage placement. FLUOROSCOPY TIME:  35 seconds Fluoroscopy dose: 10.4 mGy. Electronically Signed   By: Donzetta Kohut M.D.   On: 02/03/2021 14:49   DG C-Arm 1-60 Min-No Report  Result Date: 02/03/2021 Fluoroscopy was utilized by the requesting physician.  No radiographic interpretation.    Assessment/Plan: Patient is post-op day 1 s/p L4/5 decompression and fusion. She is recovering well and reports a significant reduction in her preoperative symptoms.  She has appropriate incisional discomfort.  She has ambulated with nursing staff and is awaiting PT/OT evaluation. She reports a significantly depressed mood and feels that she is unable to return home at this time due to family dynamics. She had a son die, husband with CHF, and a sick mother she is caring for. Her perioperative period has made thoughts and emotions return which is currently struggling with. She stated she is "mentally exhausted and can't go home." She does not have any suicidal ideation but did state that she would have been fine with not waking up from surgery yesterday. Continue LSO brace when OOB. Continue working on pain control, mobility and ambulating patient. Will consult CSW and psych for assistance.   LOS: 1 day     Council Mechanic, DNP, NP-C 02/04/2021, 8:03 AM

## 2021-02-04 NOTE — Progress Notes (Signed)
  Laura Sanders displayed depressed mood throughout the night. She was extremely frustrated and tearful. After discussing her feelings, she informed me that it wasn't the care she was receiving that had her frustrated. There are many family dynamics that has her stressed. She voiced needing to go somewhere to relax and receive counseling in order to help process her emotions. She doesn't feel like her antidepressants are working. She denies having thoughts and feelings of suicide. She denied having a plan to self-harm.   She requested to speak with a Child psychotherapist to explore her options.

## 2021-02-04 NOTE — Evaluation (Signed)
Occupational Therapy Evaluation Patient Details Name: Laura Sanders MRN: [id number] DOB: Oct 04, 1950 Today's Date: 02/04/2021    History of Present Illness Patient is s/p L4/5 decompression and fusion   Clinical Impression   Patient presents s/p spinal surgery with brace and spinal precautions. Patient educated on spinal precautions and provided with handout. Patient educated on how to perform ADLs and mobility while maintaining spinal precautions. Patient demonstrated ability to perform lower body dressing and toileting without breaking spinal precautions and without physical assistance. Patient used RW in room and currently without pain. Patient has no further OT needs.    Follow Up Recommendations  No OT follow up    Equipment Recommendations  None recommended by OT    Recommendations for Other Services       Precautions / Restrictions Precautions Precautions: Back Precaution Booklet Issued: Yes (comment) Required Braces or Orthoses: Spinal Brace Spinal Brace: Applied in sitting position;Lumbar corset Restrictions Weight Bearing Restrictions: No      Mobility Bed Mobility Overal bed mobility: Needs Assistance Bed Mobility: Supine to Sit     Supine to sit: Supervision     General bed mobility comments: verbal cues for log roll technique    Transfers Overall transfer level: Needs assistance Equipment used: Rolling walker (2 wheeled) Transfers: Sit to/from Stand Sit to Stand: Supervision         General transfer comment: overall supervision for mobility.    Balance Overall balance assessment: No apparent balance deficits (not formally assessed)                                         ADL either performed or assessed with clinical judgement   ADL Overall ADL's : Modified independent                                       General ADL Comments: Demonstrated modified independence after instruction on spinal precautions to  perform ADLs. Educated patient on compensatory strategies.     Vision Patient Visual Report: No change from baseline       Perception     Praxis      Pertinent Vitals/Pain Pain Assessment: No/denies pain     Hand Dominance Right   Extremity/Trunk Assessment Upper Extremity Assessment Upper Extremity Assessment: Overall WFL for tasks assessed   Lower Extremity Assessment Lower Extremity Assessment: Defer to PT evaluation   Cervical / Trunk Assessment Cervical / Trunk Assessment: Normal   Communication Communication Communication: No difficulties   Cognition Arousal/Alertness: Awake/alert Behavior During Therapy: WFL for tasks assessed/performed Overall Cognitive Status: Within Functional Limits for tasks assessed                                     General Comments       Exercises     Shoulder Instructions      Home Living Family/patient expects to be discharged to:: Private residence Living Arrangements: Spouse/significant other Available Help at Discharge: Family;Available 24 hours/day Type of Home: House Home Access: Stairs to enter [employer] of Steps: 3 Entrance Stairs-Rails: Right;Left Home Layout: One level     Bathroom Shower/Tub: Tub/shower unit;Walk-in shower   Bathroom Toilet: Handicapped height     Home Equipment: None  Prior Functioning/Environment Level of Independence: Independent                 OT Problem List: Pain;Decreased knowledge of precautions      OT Treatment/Interventions:      OT Goals(Current goals can be found in the care plan section) Acute Rehab OT Goals OT Goal Formulation: All assessment and education complete, DC therapy  OT Frequency:     Barriers to D/C:            Co-evaluation              AM-PAC OT "6 Clicks" Daily Activity     Outcome Measure Help from another person eating meals?: None Help from another person taking care of personal grooming?:  None Help from another person toileting, which includes using toliet, bedpan, or urinal?: None Help from another person bathing (including washing, rinsing, drying)?: None Help from another person to put on and taking off regular upper body clothing?: None Help from another person to put on and taking off regular lower body clothing?: None 6 Click Score: 24   End of Session Equipment Utilized During Treatment: Rolling walker Nurse Communication: Mobility status (No OT needs)  Activity Tolerance: Patient tolerated treatment well Patient left: Other (comment) (in bathroom, RN notified)  OT Visit Diagnosis: Pain                Time: 0258-5277 OT Time Calculation (min): 25 min Charges:  OT General Charges $OT Visit: 1 Visit OT Evaluation $OT Eval Low Complexity: 1 Low OT Treatments $Self Care/Home Management : 8-22 mins  Jonika Critz, OTR/L Acute Care Rehab Services  Office [phone] Pager: [phone]   Kelli Churn 02/04/2021, 9:05 AM

## 2021-02-04 NOTE — TOC Progression Note (Addendum)
Transition of Care [facility]) - Progression Note    Patient Details  Name: Laura Sanders MRN: [id number] Date of Birth: 1950-10-06  [facility] of [facility] CM/SW Contact  Beckie Busing, RN Phone Number:[phone]  No results found for: CRP  02/04/2021, 10:54 AM  Clinical Narrative:    CM consulted for patient that nurse states wants to speak with someone about options for disposition. CM at bedside with patient. Patient states that she is from home with husband where she functions independently and helps take care of her husband who has heart failure. Patient states that she really feels that she needs her psych meds changed because she has been taking the same psych meds since her son died over 20 years ago. CM explained that insurance would not pay for rehab if the patient does not demonstrate a physical need and currently per chart there is no recommendation. Patient states that she does understand and just really wants to speak with a psychiatrist because she feels that she is depressed and needs a medication adjustment. Psych consult has been ordered and pending. TOC will sign off for now due to no current needs.   1230 Outpatient referral faxed to [facility] per MD request.        Expected Discharge Plan and Services                                                 Social Determinants of Health (SDOH) Interventions    Readmission Risk Interventions No flowsheet data found.

## 2021-02-04 NOTE — Anesthesia Postprocedure Evaluation (Signed)
Anesthesia Post Note  Patient: Laura Sanders  Procedure(s) Performed: Lumbar four-five Decompression/ lumbar interbody fusion (Back) APPLICATION OF PULSE     Patient location during evaluation: PACU Anesthesia Type: General Level of consciousness: awake and alert Pain management: pain level controlled Vital Signs Assessment: post-procedure vital signs reviewed and stable Respiratory status: spontaneous breathing, nonlabored ventilation, respiratory function stable and patient connected to nasal cannula oxygen Cardiovascular status: blood pressure returned to baseline and stable Postop Assessment: no apparent nausea or vomiting Anesthetic complications: no   No notable events documented.  Last Vitals:  Vitals:   02/04/21 0607 02/04/21 0736  BP: (!) 101/58 (!) 102/57  Pulse: 65 68  Resp: 18 18  Temp: 36.5 C 36.4 C  SpO2: 99% 98%    Last Pain:  Vitals:   02/04/21 0736  TempSrc: Oral  PainSc:                  [name] S

## 2021-02-04 NOTE — Evaluation (Signed)
Physical Therapy Evaluation Patient Details Name: Laura Sanders MRN: [id number] DOB: 01/17/1951 Today's Date: 02/04/2021   History of Present Illness  Pt is a 70 y/o female who presents s/p L4-L5 PLIF on 02/03/2021. PMH significant for fibromyalgia, HTN, R TKR.  Clinical Impression  Pt admitted with above diagnosis. At the time of PT eval, pt was able to demonstrate transfers and ambulation with gross supervision for safety and RW for support. Pt was educated on precautions, brace application/wearing schedule, appropriate activity progression, and car transfer. Pt currently with functional limitations due to the deficits listed below (see PT Problem List). Pt will benefit from skilled PT to increase their independence and safety with mobility to allow discharge to the venue listed below.      Follow Up Recommendations No PT follow up;Supervision for mobility/OOB    Equipment Recommendations  Rolling walker with 5" wheels;3in1 (PT)    Recommendations for Other Services       Precautions / Restrictions Precautions Precautions: Back Precaution Booklet Issued: Yes (comment) Required Braces or Orthoses: Spinal Brace Spinal Brace: Applied in sitting position;Lumbar corset Restrictions Weight Bearing Restrictions: No      Mobility  Bed Mobility Overal bed mobility: Needs Assistance Bed Mobility: Rolling;Sidelying to Sit;Sit to Sidelying Rolling: Modified independent (Device/Increase time) Sidelying to sit: Supervision Supine to sit: Supervision   Sit to sidelying: Supervision General bed mobility comments: VC's for improved log roll technique. HOB flat and rails lowered to simulate home environment.    Transfers Overall transfer level: Needs assistance Equipment used: Rolling walker (2 wheeled) Transfers: Sit to/from Stand Sit to Stand: Supervision         General transfer comment: VC's for hand placement on seated surface for safety.  Ambulation/Gait Ambulation/Gait  assistance: Supervision Gait Distance (Feet): 250 Feet Assistive device: Rolling walker (2 wheeled) Gait Pattern/deviations: Step-through pattern;Decreased stride length;Trunk flexed Gait velocity: Decreased Gait velocity interpretation: <1.31 ft/sec, indicative of household ambulator General Gait Details: Pt with good posture throughout. Slow but generally steady with RW for support.  Stairs Stairs: Yes Stairs assistance: Min guard Stair Management: Step to pattern;Forwards;One rail Right Number of Stairs: 4 General stair comments: VC's for sequencing and general safety.  Wheelchair Mobility    Modified Rankin (Stroke Patients Only)       Balance Overall balance assessment: Mild deficits observed, not formally tested                                           Pertinent Vitals/Pain Pain Assessment: Faces Faces Pain Scale: Hurts even more Pain Location: Incision site Pain Descriptors / Indicators: Operative site guarding Pain Intervention(s): Limited activity within patient's tolerance;Monitored during session;Repositioned    Home Living Family/patient expects to be discharged to:: Private residence Living Arrangements: Spouse/significant other Available Help at Discharge: Family;Available 24 hours/day Type of Home: House Home Access: Stairs to enter Entrance Stairs-Rails: Doctor, general practice of Steps: 3 Home Layout: One level Home Equipment: None      Prior Function Level of Independence: Independent               Hand Dominance   Dominant Hand: Right    Extremity/Trunk Assessment   Upper Extremity Assessment Upper Extremity Assessment: Defer to OT evaluation    Lower Extremity Assessment Lower Extremity Assessment: Generalized weakness (Consistent with pre-op diagnosis)    Cervical / Trunk Assessment Cervical / Trunk Assessment: Other  exceptions Cervical / Trunk Exceptions: s/p surgery  Communication    Communication: No difficulties  Cognition Arousal/Alertness: Awake/alert Behavior During Therapy: WFL for tasks assessed/performed Overall Cognitive Status: Within Functional Limits for tasks assessed                                        General Comments      Exercises     Assessment/Plan    PT Assessment Patient needs continued PT services  PT Problem List Decreased strength;Decreased activity tolerance;Decreased balance;Decreased mobility;Decreased knowledge of use of DME;Decreased safety awareness;Decreased knowledge of precautions;Pain       PT Treatment Interventions DME instruction;Gait training;Stair training;Functional mobility training;Therapeutic activities;Therapeutic exercise;Neuromuscular re-education;Patient/family education    PT Goals (Current goals can be found in the Care Plan section)  Acute Rehab PT Goals Patient Stated Goal: Return to pilates PT Goal Formulation: With patient Time For Goal Achievement: 02/11/21 Potential to Achieve Goals: Good    Frequency Min 5X/week   Barriers to discharge        Co-evaluation               AM-PAC PT "6 Clicks" Mobility  Outcome Measure Help needed turning from your back to your side while in a flat bed without using bedrails?: None Help needed moving from lying on your back to sitting on the side of a flat bed without using bedrails?: A Little Help needed moving to and from a bed to a chair (including a wheelchair)?: A Little Help needed standing up from a chair using your arms (e.g., wheelchair or bedside chair)?: A Little Help needed to walk in hospital room?: A Little Help needed climbing 3-5 steps with a railing? : A Little 6 Click Score: 19    End of Session Equipment Utilized During Treatment: Gait belt;Back brace Activity Tolerance: Patient tolerated treatment well Patient left: in bed;with call bell/phone within reach Nurse Communication: Mobility status PT Visit Diagnosis:  Unsteadiness on feet (R26.81);Pain Pain - part of body:  (back)    Time: 7829-5621 PT Time Calculation (min) (ACUTE ONLY): 20 min   Charges:   PT Evaluation $PT Eval Low Complexity: 1 Low          Conni Slipper, PT, DPT Acute Rehabilitation Services Pager: [phone] Office: [phone]   Marylynn Pearson 02/04/2021, 11:31 AM

## 2021-02-05 MED ORDER — HYDROCODONE-ACETAMINOPHEN 5-325 MG PO TABS: 1.0000 | ORAL_TABLET | Freq: Four times a day (QID) | ORAL | 0 refills | Status: DC | PRN

## 2021-02-05 MED ORDER — METHOCARBAMOL 500 MG PO TABS: 500.0000 mg | ORAL_TABLET | Freq: Four times a day (QID) | ORAL | 0 refills | Status: DC | PRN

## 2021-02-05 NOTE — Plan of Care (Signed)
Pt given D/C instructions with verbal understanding. Rx's were sent to the pharmacy by MD. Pt's incision is clean and dry with no sign of infection. Pt's IV was removed prior to D/C. Pt received RW and 3-n-1 per MD order. Pt D/C'd home via wheelchair. Pt is stable @ D/C and has no other needs at this time. Rema Fendt, RN

## 2021-02-05 NOTE — Discharge Instructions (Signed)

## 2021-02-05 NOTE — Progress Notes (Signed)
Subjective: Patient reports that she is having moderate incisional discomfort that is well controled on PO analgesics and some residual right great toe paraesthesias. Her mood has improved and she is not feeling as depressed today. No acute events overnight.   Objective: Vital signs in last 24 hours: Temp:  [98 F (36.7 C)-98.6 F (37 C)] 98 F (36.7 C) (08/25 0530) Pulse Rate:  [69-76] 69 (08/25 0530) Resp:  [16-18] 18 (08/25 0530) BP: (84-120)/(53-74) 89/53 (08/25 0530) SpO2:  [95 %-100 %] 96 % (08/25 0530)  Intake/Output from previous day: No intake/output data recorded. Intake/Output this shift: No intake/output data recorded.  Physical Exam: Patient is awake, A/O X 4. She has a flat affect. They are in NAD and VSS. Speech is fluent and appropriate. MAEW with good strength that is symmetric bilaterally. Sensation to light touch is intact. PERLA, EOMI. CNs grossly intact. Dressing is clean dry intact. Incision is well approximated with no drainage, erythema, or edema.  Lab Results: No results for input(s): WBC, HGB, HCT, PLT in the last 72 hours. BMET No results for input(s): NA, K, CL, CO2, GLUCOSE, BUN, CREATININE, CALCIUM in the last 72 hours.  Studies/Results: DG Lumbar Spine 2-3 Views  Result Date: 02/03/2021 CLINICAL DATA:  L4-5 decompression/lumbar interbody fusion. EXAM: LUMBAR SPINE - 2-3 VIEW; DG C-ARM 1-60 MIN-NO REPORT COMPARISON:  Previous lumbar spine evaluation from September 17, 2020. FINDINGS: Three intraoperative spot radiographs are provided. Lateral projection shows interval placement of pedicle screws at the L4-5 level. Next frontal view shows similar findings. Interbody cage placement is noted with signs of laminectomy on frontal view. Last image with similar findings of pedicle screws and interbody cage placement. Slightly improved anterolisthesis of L4 on L5 following interbody cage placement. IMPRESSION: Interbody spot radiographs, 3 total images are submitted with  no immediate complication. Signs of laminectomy, bilateral L4-5 pedicle screw placement and interbody cage placement. FLUOROSCOPY TIME:  35 seconds Fluoroscopy dose: 10.4 mGy. Electronically Signed   By: Donzetta Kohut M.D.   On: 02/03/2021 14:49   DG C-Arm 1-60 Min-No Report  Result Date: 02/03/2021 CLINICAL DATA:  L4-5 decompression/lumbar interbody fusion. EXAM: LUMBAR SPINE - 2-3 VIEW; DG C-ARM 1-60 MIN-NO REPORT COMPARISON:  Previous lumbar spine evaluation from September 17, 2020. FINDINGS: Three intraoperative spot radiographs are provided. Lateral projection shows interval placement of pedicle screws at the L4-5 level. Next frontal view shows similar findings. Interbody cage placement is noted with signs of laminectomy on frontal view. Last image with similar findings of pedicle screws and interbody cage placement. Slightly improved anterolisthesis of L4 on L5 following interbody cage placement. IMPRESSION: Interbody spot radiographs, 3 total images are submitted with no immediate complication. Signs of laminectomy, bilateral L4-5 pedicle screw placement and interbody cage placement. FLUOROSCOPY TIME:  35 seconds Fluoroscopy dose: 10.4 mGy. Electronically Signed   By: Donzetta Kohut M.D.   On: 02/03/2021 14:49    Assessment/Plan: Patient is post-op day 2 s/p L4/5 decompression and fusion. She is continuing to recover well from her surgery. She has appropriate incisional discomfort. She has worked with PT/OT. She continues to report a significantly depressed mood, although improved from yesterday. Psychiatry was consulted for assistance. Psych reports the patient is not a threat to herself or others and she is safe to be discharged home and follow up as an outpatient. Continue LSO brace when OOB. Continue working on pain control, mobility and ambulating patient. Will plan for discharge today.   LOS: 2 days     Ivin Booty  Tressia Danas, DNP, NP-C 02/05/2021, 8:03 AM

## 2021-02-05 NOTE — Discharge Summary (Signed)
Physician Discharge Summary  Patient ID: LAYLONIE MARZEC MRN: [id number] DOB/AGE: [birth year] 70 y.o.  Admit date: 02/03/2021 Discharge date: 02/05/2021  Admission Diagnoses: Spondylolisthesis, Lumbar region, degenerative lumbar spinal stenosis, lumbago, radiculopathy L 45 level  Discharge Diagnoses: Spondylolisthesis, Lumbar region, degenerative lumbar spinal stenosis, lumbago, radiculopathy L 45 level Active Problems:   Spondylolisthesis of lumbar region   GAD (generalized anxiety disorder)   MDD (major depressive disorder), recurrent episode, mild (HCC)   Discharged Condition: good  Hospital Course: The patient was admitted on 02/03/2021 and taken to the operating room where the patient underwent L4/5 decompression and fusion. The patient tolerated the procedure well and was taken to the recovery room and then to the floor in stable condition. The hospital course was routine. There were no complications. The wound remained clean dry and intact. Pt had appropriate back soreness. No complaints of leg pain or new N/T/W. The patient remained afebrile with stable vital signs, and tolerated a regular diet. The patient continued to increase activities, and pain was well controlled with oral pain medications. During her admission, she reported a depressed mood and had passive suicidal ideations. Psychiatry was consulted for assistance. She was deemed to be safe for discharge home and will follow up with psychiatry as an outpatient.   Consults: Psychiatry   Significant Diagnostic Studies: radiology: X-Ray: intraoperative   Treatments: surgery: Lumbar four-five Decompression/ lumbar interbody fusion (N/A) APPLICATION OF PULSE with PEEK cages, autograft, pedicle screw fixation, posterolateral arthrodesis  Discharge Exam: Blood pressure (!) 89/53, pulse 69, temperature 98 F (36.7 C), temperature source Oral, resp. rate 18, height 5\' 4"  (1.626 m), weight 59 kg, SpO2 96 %.  Physical Exam: Patient is  awake, A/O X 4. She has a flat affect. They are in NAD and VSS. Speech is fluent and appropriate. MAEW with good strength that is symmetric bilaterally. Sensation to light touch is intact. PERLA, EOMI. CNs grossly intact. Dressing is clean dry intact. Incision is well approximated with no drainage, erythema, or edema.  Disposition: Discharge disposition: 01-Home or Self Care        Allergies as of 02/05/2021       Reactions   Bupropion Other (See Comments)   Makes patient nervous        Medication List     TAKE these medications    acetaminophen 160 MG/5ML elixir Commonly known as: TYLENOL Take 480 mg by mouth every 6 (six) hours as needed for fever.   ALPRAZolam 1 MG tablet Commonly known as: XANAX Take 1 mg by mouth 2 (two) times daily as needed for anxiety.   bisoprolol-hydrochlorothiazide 5-6.25 MG tablet Commonly known as: ZIAC Take 1 tablet by mouth 2 (two) times daily.   COLLAGEN PO Take 1 capsule by mouth daily.   estradiol 2 MG tablet Commonly known as: ESTRACE Take 2 mg by mouth daily.   fexofenadine-pseudoephedrine 60-120 MG 12 hr tablet Commonly known as: ALLEGRA-D Take 1 tablet by mouth 2 (two) times daily as needed (allergies).   HYDROcodone-acetaminophen 5-325 MG tablet Commonly known as: NORCO/VICODIN Take 1-2 tablets by mouth every 6 (six) hours as needed for moderate pain. What changed:  how much to take when to take this   methocarbamol 500 MG tablet Commonly known as: ROBAXIN Take 1 tablet (500 mg total) by mouth every 6 (six) hours as needed for muscle spasms.   pantoprazole 40 MG tablet Commonly known as: PROTONIX Take 40 mg by mouth 2 (two) times daily.   traZODone 100 MG tablet  Commonly known as: DESYREL Take 100 mg by mouth at bedtime.   venlafaxine XR 75 MG 24 hr capsule Commonly known as: EFFEXOR-XR Take 225 mg by mouth daily with breakfast.   Vitamin D 50 MCG (2000 UT) tablet Take 2,000 Units by mouth daily.          Signed: Council Mechanic, DNP, NP-C 02/05/2021, 8:13 AM

## 2021-02-05 NOTE — Progress Notes (Signed)
Physical Therapy Treatment Patient Details Name: Laura Sanders MRN: [id number] DOB: Dec 08, 1950 Today's Date: 02/05/2021    History of Present Illness Pt is a 70 y/o female who presents s/p L4-L5 PLIF on 02/03/2021. PMH significant for fibromyalgia, HTN, R TKR.    PT Comments    Pt progressing well with post-op mobility. She was able to demonstrate transfers and ambulation with gross min guard assist and HHA for support. Pt would still like to utilize RW due to feeling unsteady, and feel this is reasoable. Pt was educated on precautions, brace application/wearing schedule, appropriate activity progression, and car transfer. Will continue to follow.      Follow Up Recommendations  No PT follow up;Supervision for mobility/OOB     Equipment Recommendations  Rolling walker with 5" wheels;3in1 (PT)    Recommendations for Other Services       Precautions / Restrictions Precautions Precautions: Back Precaution Booklet Issued: Yes (comment) Required Braces or Orthoses: Spinal Brace Spinal Brace: Applied in sitting position;Lumbar corset Restrictions Weight Bearing Restrictions: No    Mobility  Bed Mobility Overal bed mobility: Modified Independent Bed Mobility: Rolling;Sidelying to Sit;Sit to Sidelying           General bed mobility comments: HOB flat and rails lowered to simulate home environment. No assist required.    Transfers Overall transfer level: Needs assistance Equipment used: None Transfers: Sit to/from Stand Sit to Stand: Supervision         General transfer comment: Attempted to stand without UE support but returned to sitting. Powered up to full stand with hand placement on bed and was able to achieve without assist.  Ambulation/Gait Ambulation/Gait assistance: Min assist;Min guard Gait Distance (Feet): 300 Feet Assistive device: 1 person hand held assist Gait Pattern/deviations: Step-through pattern;Decreased stride length;Trunk flexed Gait velocity:  Pt wanted to Gait velocity interpretation: <1.8 ft/sec, indicate of risk for recurrent falls General Gait Details: HHA   Stairs         General stair comments: Declined further stair training   Wheelchair Mobility    Modified Rankin (Stroke Patients Only)       Balance Overall balance assessment: Mild deficits observed, not formally tested                                          Cognition Arousal/Alertness: Awake/alert Behavior During Therapy: WFL for tasks assessed/performed Overall Cognitive Status: Within Functional Limits for tasks assessed                                        Exercises      General Comments        Pertinent Vitals/Pain Pain Assessment: Faces Faces Pain Scale: Hurts even more Pain Location: Incision site Pain Descriptors / Indicators: Operative site guarding Pain Intervention(s): Limited activity within patient's tolerance;Monitored during session;Repositioned    Home Living                      Prior Function            PT Goals (current goals can now be found in the care plan section) Acute Rehab PT Goals Patient Stated Goal: Return to pilates PT Goal Formulation: With patient Time For Goal Achievement: 02/11/21 Potential to Achieve Goals: Good Progress towards PT goals:  Progressing toward goals    Frequency    Min 5X/week      PT Plan Current plan remains appropriate    Co-evaluation              AM-PAC PT "6 Clicks" Mobility   Outcome Measure  Help needed turning from your back to your side while in a flat bed without using bedrails?: None Help needed moving from lying on your back to sitting on the side of a flat bed without using bedrails?: None Help needed moving to and from a bed to a chair (including a wheelchair)?: A Little Help needed standing up from a chair using your arms (e.g., wheelchair or bedside chair)?: A Little Help needed to walk in hospital  room?: A Little Help needed climbing 3-5 steps with a railing? : A Little 6 Click Score: 20    End of Session Equipment Utilized During Treatment: Gait belt;Back brace Activity Tolerance: Patient tolerated treatment well Patient left: in bed;with call bell/phone within reach Nurse Communication: Mobility status PT Visit Diagnosis: Unsteadiness on feet (R26.81);Pain Pain - part of body:  (back)     Time: 8182-9937 PT Time Calculation (min) (ACUTE ONLY): 17 min  Charges:  $Gait Training: 8-22 mins                     Conni Slipper, PT, DPT Acute Rehabilitation Services Pager: [phone] Office: [phone]    Marylynn Pearson 02/05/2021, 10:50 AM

## 2021-03-11 ENCOUNTER — Ambulatory Visit: Payer: 59 | Admitting: Psychiatry

## 2021-03-19 DIAGNOSIS — M19041 Primary osteoarthritis, right hand: Secondary | ICD-10-CM | POA: Insufficient documentation

## 2021-03-19 DIAGNOSIS — M19042 Primary osteoarthritis, left hand: Secondary | ICD-10-CM | POA: Insufficient documentation

## 2021-04-14 ENCOUNTER — Encounter: Payer: Self-pay | Admitting: Psychiatry

## 2021-04-14 ENCOUNTER — Other Ambulatory Visit: Payer: Self-pay

## 2021-04-14 ENCOUNTER — Ambulatory Visit (INDEPENDENT_AMBULATORY_CARE_PROVIDER_SITE_OTHER): Payer: Medicare Other | Admitting: Psychiatry

## 2021-04-14 ENCOUNTER — Telehealth: Payer: Self-pay | Admitting: Psychiatry

## 2021-04-14 ENCOUNTER — Other Ambulatory Visit
Admission: RE | Admit: 2021-04-14 | Discharge: 2021-04-14 | Disposition: A | Discharge status: Home / Self Care | Payer: Medicare Other | Source: Ambulatory Visit | Attending: Psychiatry | Admitting: Psychiatry

## 2021-04-14 VITALS — BP 102/68 | HR 72 | Ht 64.0 in | Wt 134.0 lb

## 2021-04-14 DIAGNOSIS — R413 Other amnesia: Secondary | ICD-10-CM | POA: Insufficient documentation

## 2021-04-14 DIAGNOSIS — F411 Generalized anxiety disorder: Secondary | ICD-10-CM

## 2021-04-14 DIAGNOSIS — F321 Major depressive disorder, single episode, moderate: Secondary | ICD-10-CM | POA: Insufficient documentation

## 2021-04-14 DIAGNOSIS — Z23 Encounter for immunization: Secondary | ICD-10-CM | POA: Insufficient documentation

## 2021-04-14 DIAGNOSIS — Z808 Family history of malignant neoplasm of other organs or systems: Secondary | ICD-10-CM | POA: Insufficient documentation

## 2021-04-14 DIAGNOSIS — L570 Actinic keratosis: Secondary | ICD-10-CM | POA: Insufficient documentation

## 2021-04-14 LAB — VITAMIN B12: Vitamin B-12: 272 pg/mL (ref 180–914)

## 2021-04-14 MED ORDER — MIRTAZAPINE 7.5 MG PO TABS: 7.5000 mg | ORAL_TABLET | Freq: Every day | ORAL | 1 refills | Status: DC

## 2021-04-14 MED ORDER — VENLAFAXINE HCL ER 75 MG PO CP24: 150.0000 mg | ORAL_CAPSULE | Freq: Every day | ORAL | 1 refills | Status: DC

## 2021-04-14 MED ORDER — HYDROXYZINE HCL 25 MG PO TABS: 12.5000 mg | ORAL_TABLET | Freq: Two times a day (BID) | ORAL | 1 refills | Status: DC | PRN

## 2021-04-14 NOTE — Telephone Encounter (Signed)
Attempted to contact patient to discuss lab results-vitamin B12.  Left voicemail.

## 2021-04-14 NOTE — Patient Instructions (Signed)
Hydroxyzine Capsules or Tablets What is this medication? HYDROXYZINE (hye DROX i zeen) treats the symptoms of allergies and allergic reactions. It may also be used to treat anxiety or cause drowsiness before a procedure. It works by blocking histamine, a substance released by the body during an allergic reaction. It belongs to a group of medications called antihistamines. This medicine may be used for other purposes; ask your health care provider or pharmacist if you have questions. COMMON BRAND NAME(S): ANX, Atarax, Rezine, Vistaril What should I tell my care team before I take this medication? They need to know if you have any of these conditions: Glaucoma Heart disease History of irregular heartbeat Kidney disease Liver disease Lung or breathing disease, like asthma Stomach or intestine problems Thyroid disease Trouble passing urine An unusual or allergic reaction to hydroxyzine, cetirizine, other medications, foods, dyes or preservatives Pregnant or trying to get pregnant Breast-feeding How should I use this medication? Take this medication by mouth with a full glass of water. Follow the directions on the prescription label. You may take this medication with food or on an empty stomach. Take your medication at regular intervals. Do not take your medication more often than directed. Talk to your care team regarding the use of this medication in children. Special care may be needed. While this medication may be prescribed for children as young as 6 years of age for selected conditions, precautions do apply. Patients over 65 years old may have a stronger reaction and need a smaller dose. Overdosage: If you think you have taken too much of this medicine contact a poison control center or emergency room at once. NOTE: This medicine is only for you. Do not share this medicine with others. What if I miss a dose? If you miss a dose, take it as soon as you can. If it is almost time for your next  dose, take only that dose. Do not take double or extra doses. What may interact with this medication? Do not take this medication with any of the following: Cisapride Dronedarone Pimozide Thioridazine This medication may also interact with the following: Alcohol Antihistamines for allergy, cough, and cold Atropine Barbiturate medications for sleep or seizures, like phenobarbital Certain antibiotics like erythromycin or clarithromycin Certain medications for anxiety or sleep Certain medications for bladder problems like oxybutynin, tolterodine Certain medications for depression or psychotic disturbances Certain medications for irregular heart beat Certain medications for Parkinson's disease like benztropine, trihexyphenidyl Certain medications for seizures like phenobarbital, primidone Certain medications for stomach problems like dicyclomine, hyoscyamine Certain medications for travel sickness like scopolamine Ipratropium Narcotic medications for pain Other medications that prolong the QT interval (which can cause an abnormal heart rhythm) like dofetilide This list may not describe all possible interactions. Give your health care provider a list of all the medicines, herbs, non-prescription drugs, or dietary supplements you use. Also tell them if you smoke, drink alcohol, or use illegal drugs. Some items may interact with your medicine. What should I watch for while using this medication? Tell your care team if your symptoms do not improve. You may get drowsy or dizzy. Do not drive, use machinery, or do anything that needs mental alertness until you know how this medication affects you. Do not stand or sit up quickly, especially if you are an older patient. This reduces the risk of dizzy or fainting spells. Alcohol may interfere with the effect of this medication. Avoid alcoholic drinks. Your mouth may get dry. Chewing sugarless gum or sucking hard   candy, and drinking plenty of water may  help. Contact your care team if the problem does not go away or is severe. This medication may cause dry eyes and blurred vision. If you wear contact lenses you may feel some discomfort. Lubricating drops may help. See your eye care specialist if the problem does not go away or is severe. If you are receiving skin tests for allergies, tell your care team you are using this medication. What side effects may I notice from receiving this medication? Side effects that you should report to your care team as soon as possible: Allergic reactions-skin rash, itching, hives, swelling of the face, lips, tongue, or throat Heart rhythm changes-fast or irregular heartbeat, dizziness, feeling faint or lightheaded, chest pain, trouble breathing Side effects that usually do not require medical attention (report to your care team if they continue or are bothersome): Confusion Drowsiness Dry mouth Hallucinations Headache This list may not describe all possible side effects. Call your doctor for medical advice about side effects. You may report side effects to FDA at 1-800-FDA-1088. Where should I keep my medication? Keep out of the reach of children and pets. Store at room temperature between 15 and 30 degrees C (59 and 86 degrees F). Keep container tightly closed. Throw away any unused medication after the expiration date. NOTE: This sheet is a summary. It may not cover all possible information. If you have questions about this medicine, talk to your doctor, pharmacist, or health care provider.  2022 [employer] Standard (2020-08-12 15:19:25) Mirtazapine Tablets What is this medication? MIRTAZAPINE (mir TAZ a peen) treats depression. It increases the amount of serotonin and norepinephrine in the brain, hormones that help regulate mood. This medicine may be used for other purposes; ask your health care provider or pharmacist if you have questions. COMMON BRAND NAME(S): Remeron What should I tell my care team  before I take this medication? They need to know if you have any of these conditions: Bipolar disorder Glaucoma Kidney disease Liver disease Suicidal thoughts An unusual or allergic reaction to mirtazapine, other medications, foods, dyes, or preservatives Pregnant or trying to get pregnant Breast-feeding How should I use this medication? Take this medication by mouth with a glass of water. Follow the directions on the prescription label. Take your medication at regular intervals. Do not take your medication more often than directed. Do not stop taking this medication suddenly except upon the advice of your care team. Stopping this medication too quickly may cause serious side effects or your condition may worsen. A special MedGuide will be given to you by the pharmacist with each prescription and refill. Be sure to read this information carefully each time. Talk to your care team about the use of this medication in children. Special care may be needed. Overdosage: If you think you have taken too much of this medicine contact a poison control center or emergency room at once. NOTE: This medicine is only for you. Do not share this medicine with others. What if I miss a dose? If you miss a dose, take it as soon as you can. If it is almost time for your next dose, take only that dose. Do not take double or extra doses. What may interact with this medication? Do not take this medication with any of the following: Linezolid MAOIs like Carbex, Eldepryl, Marplan, Nardil, and Parnate Methylene blue (injected into a vein) This medication may also interact with the following: Alcohol Antiviral medications for HIV or AIDS Certain medications that  treat or prevent blood clots like warfarin Certain medications for depression, anxiety, or psychotic disturbances Certain medications for fungal infections like ketoconazole and itraconazole Certain medications for migraine headache like almotriptan,  eletriptan, frovatriptan, naratriptan, rizatriptan, sumatriptan, zolmitriptan Certain medications for seizures like carbamazepine or phenytoin Certain medications for sleep Cimetidine Erythromycin Fentanyl Lithium Medications for blood pressure Nefazodone Rasagiline Rifampin Supplements like St. John's wort, kava kava, valerian Tramadol Tryptophan This list may not describe all possible interactions. Give your health care provider a list of all the medicines, herbs, non-prescription drugs, or dietary supplements you use. Also tell them if you smoke, drink alcohol, or use illegal drugs. Some items may interact with your medicine. What should I watch for while using this medication? Tell your care team if your symptoms do not get better or if they get worse. Visit your care team for regular checks on your progress. Because it may take several weeks to see the full effects of this medication, it is important to continue your treatment as prescribed by your doctor. Patients and their families should watch out for new or worsening thoughts of suicide or depression. Also watch out for sudden changes in feelings such as feeling anxious, agitated, panicky, irritable, hostile, aggressive, impulsive, severely restless, overly excited and hyperactive, or not being able to sleep. If this happens, especially at the beginning of treatment or after a change in dose, call your care team. You may get drowsy or dizzy. Do not drive, use machinery, or do anything that needs mental alertness until you know how this medication affects you. Do not stand or sit up quickly, especially if you are an older patient. This reduces the risk of dizzy or fainting spells. Alcohol may interfere with the effect of this medication. Avoid alcoholic drinks. This medication may cause dry eyes and blurred vision. If you wear contact lenses you may feel some discomfort. Lubricating drops may help. See your eye care team if the problem  does not go away or is severe. Your mouth may get dry. Chewing sugarless gum or sucking hard candy, and drinking plenty of water may help. Contact your care team if the problem does not go away or is severe. What side effects may I notice from receiving this medication? Side effects that you should report to your care team as soon as possible: Allergic reactions-skin rash, itching, hives, swelling of the face, lips, tongue, or throat Heart rhythm changes-fast or irregular heartbeat, dizziness, feeling faint or lightheaded, chest pain, trouble breathing Infection-fever, chills, cough, or sore throat Irritability, confusion, fast or irregular heartbeat, muscle stiffness, twitching muscles, sweating, high fever, seizure, chills, vomiting, diarrhea, which may be signs of serotonin syndrome Low sodium level-muscle weakness, fatigue, dizziness, headache, confusion Rash, fever, and swollen lymph nodes Redness, blistering, peeling or loosening of the skin, including inside the mouth Seizures Sudden eye pain or change in vision such as blurry vision, seeing halos around lights, vision loss Thoughts of suicide or self-harm, worsening mood, feelings of depression Side effects that usually do not require medical attention (report to your care team if they continue or are bothersome): Constipation Dizziness Drowsiness Dry mouth Increase in appetite Weight gain This list may not describe all possible side effects. Call your doctor for medical advice about side effects. You may report side effects to FDA at 1-800-FDA-1088. Where should I keep my medication? Keep out of the reach of children. Store at room temperature between 15 and 30 degrees C (59 and 86 degrees F) Protect from light  and moisture. Throw away any unused medication after the expiration date. NOTE: This sheet is a summary. It may not cover all possible information. If you have questions about this medicine, talk to your doctor, pharmacist,  or health care provider.  2022 [employer] Standard (2020-07-22 12:55:45)
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
## 2021-05-01 ENCOUNTER — Other Ambulatory Visit: Payer: Self-pay

## 2021-05-01 ENCOUNTER — Ambulatory Visit (INDEPENDENT_AMBULATORY_CARE_PROVIDER_SITE_OTHER): Payer: Medicare Other | Admitting: Psychiatry

## 2021-05-01 ENCOUNTER — Encounter: Payer: Self-pay | Admitting: Psychiatry

## 2021-05-01 VITALS — BP 113/70 | HR 76 | Temp 98.1°F | Wt 135.6 lb

## 2021-05-01 DIAGNOSIS — F411 Generalized anxiety disorder: Secondary | ICD-10-CM | POA: Diagnosis not present

## 2021-05-01 DIAGNOSIS — Z79899 Other long term (current) drug therapy: Secondary | ICD-10-CM | POA: Diagnosis not present

## 2021-05-01 DIAGNOSIS — F321 Major depressive disorder, single episode, moderate: Secondary | ICD-10-CM

## 2021-05-01 MED ORDER — QUETIAPINE FUMARATE 25 MG PO TABS: 25.0000 mg | ORAL_TABLET | Freq: Every day | ORAL | 0 refills | Status: DC

## 2021-05-01 NOTE — Progress Notes (Signed)
[facility] MD OP Progress Note  05/01/2021 12:07 PM Laura Sanders  MRN:  [id number]  Chief Complaint:  Chief Complaint   Follow-up; Anxiety; Depression; Insomnia    HPI: Laura Sanders is a 70 year old Caucasian female, married, retired, lives with her husband in [place], has a history of anxiety, depression, hyperlipidemia, thrombocytopenia, osteoarthritis of hand currently on Plaquenil per rheumatology (since she cannot take NSAIDs due to her thrombocytopenia), was evaluated in the office today.  Patient today reports she did not like the effect of mirtazapine since she was waking up every couple of hours at night.  She wants to change this medication and is not interested in dosage increase.  She continues to struggle with sleep.  Patient reports mood wise she may be feeling better than last time.  She however continues to struggle with low motivation, low energy, anhedonia, anxiety and constant worrying.  She is on a lower dosage of venlafaxine at this time and reports she is tolerating it well and does not have any significant withdrawal symptoms.  Patient denies any suicidality, homicidality or perceptual disturbances.  Patient denies any other concerns today.  Visit Diagnosis:    ICD-10-CM   1. Current moderate episode of major depressive disorder without prior episode (HCC)  F32.1 QUEtiapine (SEROQUEL) 25 MG tablet    2. GAD (generalized anxiety disorder)  F41.1 QUEtiapine (SEROQUEL) 25 MG tablet    3. High risk medication use  Z79.899 Hemoglobin A1c    Prolactin      Past Psychiatric History: Reviewed past psychiatric history from progress note on 04/14/2021.  Past trials of bupropion, venlafaxine, trazodone, Xanax, Remeron  Past Medical History:  Past Medical History:  Diagnosis Date   Allergy    Anxiety    Arthritis    hands, knee   Complication of anesthesia    REAL BAD NAUSEA    Depression    Fibromyalgia    Hypertension    Insomnia    Migraines    "RESOLVED"     PONV (postoperative nausea and vomiting)    Thrombocytopenia (HCC)     Past Surgical History:  Procedure Laterality Date   AUGMENTATION MAMMAPLASTY Bilateral 1991   BREAST BIOPSY Left 03/15/2013   benign   BREAST ENHANCEMENT SURGERY     COLONOSCOPY  02/18/2004   kaplan -normal   ESOPHAGOGASTRODUODENOSCOPY ENDOSCOPY     JOINT REPLACEMENT     right knee   TONSILLECTOMY     TOTAL KNEE ARTHROPLASTY Right 04/03/2018   Procedure: RIGHT TOTAL KNEE ARTHROPLASTY;  Surgeon: Ollen Gross, MD;  Location: [facility];  Service: Orthopedics;  Laterality: Right;    VAGINAL HYSTERECTOMY      Family Psychiatric History: Reviewed family psychiatric history from progress note on 04/14/2021.  Family History:  Family History  Problem Relation Age of Onset   Alcohol abuse Father    Colon polyps Sister    Stomach cancer Paternal Aunt    Colon cancer Neg Hx    Esophageal cancer Neg Hx    Rectal cancer Neg Hx    Mental illness Neg Hx     Social History: Reviewed social history from progress note on 04/14/2021. Social History   Socioeconomic History   Marital status: Married    Spouse name: Not on file   Number of children: 2   Years of education: Not on file   Highest education level: Not on file  Occupational History   Not on file  Tobacco Use   Smoking status: Never  Smokeless tobacco: Never  Vaping Use   Vaping Use: Never used  Substance and Sexual Activity   Alcohol use: Yes    Alcohol/week: 1.0 standard drink    Types: 1 Glasses of wine per week   Drug use: No   Sexual activity: Yes    Birth control/protection: Post-menopausal  Other Topics Concern   Not on file  Social History Narrative   Not on file   Social Determinants of Health   Financial Resource Strain: Not on file  Food Insecurity: Not on file  Transportation Needs: Not on file  Physical Activity: Not on file  Stress: Not on file  Social Connections: Not on file    Allergies:  Allergies  Allergen  Reactions   Bupropion Other (See Comments)    Makes patient nervous    Metabolic Disorder Labs: No results found for: HGBA1C, MPG No results found for: PROLACTIN No results found for: CHOL, TRIG, HDL, CHOLHDL, VLDL, LDLCALC Lab Results  Component Value Date   TSH 4.200 09/25/2019    Therapeutic Level Labs: No results found for: LITHIUM No results found for: VALPROATE No components found for:  CBMZ  Current Medications: Current Outpatient Medications  Medication Sig Dispense Refill   bisoprolol-hydrochlorothiazide (ZIAC) 5-6.25 MG tablet Take 1 tablet by mouth 2 (two) times daily.  11   Cholecalciferol (VITAMIN D) 50 MCG (2000 UT) tablet Take 2,000 Units by mouth daily.     COLLAGEN PO Take 1 capsule by mouth daily.     estradiol (ESTRACE) 2 MG tablet Take 2 mg by mouth daily.     hydroxychloroquine (PLAQUENIL) 200 MG tablet Take by mouth.     pantoprazole (PROTONIX) 40 MG tablet Take 40 mg by mouth 2 (two) times daily.     QUEtiapine (SEROQUEL) 25 MG tablet Take 1 tablet (25 mg total) by mouth at bedtime. 15 tablet 0   venlafaxine XR (EFFEXOR XR) 75 MG 24 hr capsule Take 2 capsules (150 mg total) by mouth daily with breakfast. 60 capsule 1   No current facility-administered medications for this visit.     Musculoskeletal: Strength & Muscle Tone: within normal limits Gait & Station: normal Patient leans: N/A  Psychiatric Specialty Exam: Review of Systems  Psychiatric/Behavioral:  Positive for dysphoric mood and sleep disturbance. The patient is nervous/anxious.   All other systems reviewed and are negative.  Blood pressure 113/70, pulse 76, temperature 98.1 F (36.7 C), temperature source Temporal, weight 135 lb 9.6 oz (61.5 kg).Body mass index is 23.28 kg/m.  General Appearance: Casual  Eye Contact:  Fair  Speech:  Clear and Coherent  Volume:  Normal  Mood:  Anxious and Depressed  Affect:  Congruent  Thought Process:  Goal Directed and Descriptions of  Associations: Intact  Orientation:  Full (Time, Place, and Person)  Thought Content: Logical   Suicidal Thoughts:  No  Homicidal Thoughts:  No  Memory:  Immediate;   Fair Recent;   Fair Remote;   Fair reports short term memory loss  Judgement:  Fair  Insight:  Fair  Psychomotor Activity:  Normal  Concentration:  Concentration: Fair and Attention Span: Fair  Recall:  [employer] of Knowledge: Fair  Language: Fair  Akathisia:  No  Handed:  Right  AIMS (if indicated): done, 0  Assets:  Communication Skills Desire for Improvement Housing Social Support  ADL's:  Intact  Cognition: WNL  Sleep:  Poor   Screenings: GAD-7    Flowsheet Row Office Visit from 04/14/2021 in [facility]  Total GAD-7 Score 14      PHQ2-9    Flowsheet Row Office Visit from 05/01/2021 in [facility] Office Visit from 04/14/2021 in [facility]  PHQ-2 Total Score 4 6  PHQ-9 Total Score 12 14      Flowsheet Row Admission (Discharged) from 02/03/2021 in [facility]  [facility] Pre-Admission Testing 60 from 01/27/2021 in [facility] PREADMISSION TESTING  C-SSRS RISK CATEGORY No Risk No Risk        Assessment and Plan: TEKEYAH SANTIAGO is a 70 year old Caucasian female, retired, married, lives with her husband in [place], has a history of thrombocytopenia, hyperlipidemia, osteoarthritis, currently on Plaquenil, MDD, GAD, was evaluated in office today.  Patient with worsening sleep problems, continues to have depression and anxiety will benefit from medication readjustment.  Plan MDD-unstable Effexor 150 mg p.o. daily Discontinue mirtazapine Start Seroquel 25 mg p.o. nightly Provided medication education including metabolic syndrome, tardive dyskinesia.  GAD-unstable Discontinue mirtazapine. Start Seroquel 25 mg p.o. nightly Continue Effexor 150 mg p.o. daily Patient has upcoming  appointment with therapist.  High risk medication use-will order prolactin level, hemoglobin A1c.  Follow-up in clinic in 2 weeks or sooner in person.  This note was generated in part or whole with voice recognition software. Voice recognition is usually quite accurate but there are transcription errors that can and very often do occur. I apologize for any typographical errors that were not detected and corrected.     Jomarie Longs, MD 05/01/2021, 12:07 PM

## 2021-05-01 NOTE — Patient Instructions (Signed)
Quetiapine Tablets °What is this medication? °QUETIAPINE (kwe TYE a peen) treats schizophrenia and bipolar disorder. It works by balancing the levels of dopamine and serotonin in your brain, hormones that help regulate mood, behaviors, and thoughts. It belongs to a group of medications called antipsychotics. Antipsychotic medications can be used to treat several kinds of mental health conditions. °This medicine may be used for other purposes; ask your health care provider or pharmacist if you have questions. °COMMON BRAND NAME(S): Seroquel °What should I tell my care team before I take this medication? °They need to know if you have any of these conditions: °Blockage in your bowel °Cataracts °Constipation °Dementia °Diabetes °Difficulty swallowing °Glaucoma °Heart disease °High levels of prolactin °History of breast cancer °History of irregular heartbeat °Liver disease °Low blood counts, like low white cell, platelet, or red cell counts °Low blood pressure °Parkinson's disease °Prostate disease °Seizures °Suicidal thoughts, plans or attempt; a previous suicide attempt by you or a family member °Thyroid disease °Trouble passing urine °An unusual or allergic reaction to quetiapine, other medications, foods, dyes, or preservatives °Pregnant or trying to get pregnant °Breast-feeding °How should I use this medication? °Take this medication by mouth. Swallow it with a drink of water. Follow the directions on the prescription label. If it upsets your stomach you can take it with food. Take your medication at regular intervals. Do not take it more often than directed. Do not stop taking except on the advice of your care team. °A special MedGuide will be given to you by the pharmacist with each prescription and refill. Be sure to read this information carefully each time. °Talk to your care team about the use of this medication in children. While this medication may be prescribed for children as young as 10 years for  selected conditions, precautions do apply. °Patients over age 65 years may have a stronger reaction to this medication and need smaller doses. °Overdosage: If you think you have taken too much of this medicine contact a poison control center or emergency room at once. °NOTE: This medicine is only for you. Do not share this medicine with others. °What if I miss a dose? °If you miss a dose, take it as soon as you can. If it is almost time for your next dose, take only that dose. Do not take double or extra doses. °What may interact with this medication? °Do not take this medication with any of the following: °Cisapride °Dronedarone °Metoclopramide °Pimozide °Thioridazine °This medication may also interact with the following: °Alcohol °Antihistamines for allergy, cough, and cold °Atropine °Avasimibe °Certain antivirals for HIV or hepatitis °Certain medications for anxiety or sleep °Certain medications for bladder problems like oxybutynin, tolterodine °Certain medications for depression like amitriptyline, fluoxetine, nefazodone, sertraline °Certain medications for fungal infections like fluconazole, ketoconazole, itraconazole, posaconazole °Certain medications for stomach problems like dicyclomine, hyoscyamine °Certain medications for travel sickness like scopolamine °Cimetidine °General anesthetics like halothane, isoflurane, methoxyflurane, propofol °Ipratropium °Levodopa or other medications for Parkinson's disease °Medications for blood pressure °Medications for seizures °Medications that relax muscles for surgery °Narcotic medications for pain °Other medications that prolong the QT interval (cause an abnormal heart rhythm) °Phenothiazines like chlorpromazine, prochlorperazine °Rifampin °St. John's Wort °This list may not describe all possible interactions. Give your health care provider a list of all the medicines, herbs, non-prescription drugs, or dietary supplements you use. Also tell them if you smoke, drink  alcohol, or use illegal drugs. Some items may interact with your medicine. °What should I watch for   while using this medication? °Visit your care team for regular checks on your progress. Tell your care team if symptoms do not start to get better or if they get worse. Do not stop taking except on your care team's advice. You may develop a severe reaction. Your care team will tell you how much medication to take. °You may need to have an eye exam before and during use of this medication. °This medication may increase blood sugar. Ask your care team if changes in diet or medications are needed if you have diabetes. °Patients and their families should watch out for new or worsening depression or thoughts of suicide. Also watch out for sudden or severe changes in feelings such as feeling anxious, agitated, panicky, irritable, hostile, aggressive, impulsive, severely restless, overly excited and hyperactive, or not being able to sleep. If this happens, especially at the beginning of antidepressant treatment or after a change in dose, call your care team. °You may get dizzy or drowsy. Do not drive, use machinery, or do anything that needs mental alertness until you know how this medication affects you. Do not stand or sit up quickly, especially if you are an older patient. This reduces the risk of dizzy or fainting spells. Alcohol may interfere with the effect of this medication. Avoid alcoholic drinks. °This medication can cause problems with controlling your body temperature. It can lower the response of your body to cold temperatures. If possible, stay indoors during cold weather. If you must go outdoors, wear warm clothes. It can also lower the response of your body to heat. Do not overheat. Do not over-exercise. Stay out of the sun when possible. If you must be in the sun, wear cool clothing. Drink plenty of water. If you have trouble controlling your body temperature, call your care team right away. °What side  effects may I notice from receiving this medication? °Side effects that you should report to your care team as soon as possible: °Allergic reactions--skin rash, itching, hives, swelling of the face, lips, tongue, or throat °Heart rhythm changes--fast or irregular heartbeat, dizziness, feeling faint or lightheaded, chest pain, trouble breathing °High blood sugar (hyperglycemia)--increased thirst or amount of urine, unusual weakness or fatigue, blurry vision °High fever, stiff muscles, increased sweating, fast or irregular heartbeat, and confusion, which may be signs of neuroleptic malignant syndrome °High prolactin level--unexpected breast tissue growth, discharge from the nipple, change in sex drive or performance, irregular menstrual cycle °Increase in blood pressure in children °Infection--fever, chills, cough, or sore throat °Low blood pressure--dizziness, feeling faint or lightheaded, blurry vision °Low thyroid levels (hypothyroidism)--unusual weakness or fatigue, increased sensitivity to cold, constipation, hair loss, dry skin, weight gain, feelings of depression °Pain or trouble swallowing °Seizures °Stroke--sudden numbness or weakness of the face, arm, or leg, trouble speaking, confusion, trouble walking, loss of balance or coordination, dizziness, severe headache, change in vision °Sudden eye pain or change in vision such as blurry vision, seeing halos around lights, vision loss °Thoughts of suicide or self-harm, worsening mood, feelings of depression °Trouble passing urine °Uncontrolled and repetitive body movements, muscle stiffness or spasms, tremors or shaking, loss of balance or coordination, restlessness, shuffling walk, which may be signs of extrapyramidal symptoms (EPS) °Side effects that usually do not require medical attention (report to your care team if they continue or are bothersome): °Constipation °Dizziness °Drowsiness °Dry mouth °Weight gain °This list may not describe all possible side  effects. Call your doctor for medical advice about side effects. You may report side   effects to FDA at 1-800-FDA-1088. °Where should I keep my medication? °Keep out of the reach of children. °Store at room temperature between 15 and 30 degrees C (59 and 86 degrees F). Throw away any unused medication after the expiration date. °NOTE: This sheet is a summary. It may not cover all possible information. If you have questions about this medicine, talk to your doctor, pharmacist, or health care provider. °© 2022 [employer] (2021-02-17 00:00:00) ° °

## 2021-05-06 ENCOUNTER — Other Ambulatory Visit: Payer: Self-pay | Admitting: Licensed Clinical Social Worker

## 2021-05-08 ENCOUNTER — Other Ambulatory Visit: Payer: Self-pay | Admitting: Psychiatry

## 2021-05-08 DIAGNOSIS — F411 Generalized anxiety disorder: Secondary | ICD-10-CM

## 2021-05-08 DIAGNOSIS — F321 Major depressive disorder, single episode, moderate: Secondary | ICD-10-CM

## 2021-05-20 ENCOUNTER — Other Ambulatory Visit: Payer: Self-pay | Admitting: Psychiatry

## 2021-05-20 DIAGNOSIS — F321 Major depressive disorder, single episode, moderate: Secondary | ICD-10-CM

## 2021-05-20 DIAGNOSIS — F411 Generalized anxiety disorder: Secondary | ICD-10-CM

## 2021-05-21 ENCOUNTER — Ambulatory Visit (INDEPENDENT_AMBULATORY_CARE_PROVIDER_SITE_OTHER): Payer: Self-pay | Admitting: Licensed Clinical Social Worker

## 2021-05-21 DIAGNOSIS — Z91199 Patient's noncompliance with other medical treatment and regimen due to unspecified reason: Secondary | ICD-10-CM

## 2021-05-21 NOTE — Progress Notes (Signed)
LCSW counselor tried to connect with patient for scheduled appointment in office and did not show. LCSW counselor attempted to reach pt unsuccessfully.  Someone answered phone and hung up at approximately 1:15pm.

## 2021-06-11 ENCOUNTER — Other Ambulatory Visit: Payer: Self-pay | Admitting: Psychiatry

## 2021-06-11 DIAGNOSIS — F321 Major depressive disorder, single episode, moderate: Secondary | ICD-10-CM

## 2021-06-11 DIAGNOSIS — F411 Generalized anxiety disorder: Secondary | ICD-10-CM

## 2021-08-10 ENCOUNTER — Other Ambulatory Visit: Payer: Self-pay | Admitting: Internal Medicine

## 2021-08-10 DIAGNOSIS — Z1231 Encounter for screening mammogram for malignant neoplasm of breast: Secondary | ICD-10-CM

## 2021-08-13 ENCOUNTER — Ambulatory Visit
Admission: RE | Admit: 2021-08-13 | Discharge: 2021-08-13 | Disposition: A | Discharge status: Home / Self Care | Payer: 59 | Source: Ambulatory Visit | Attending: Internal Medicine | Admitting: Internal Medicine

## 2021-08-13 ENCOUNTER — Other Ambulatory Visit: Payer: Self-pay

## 2021-08-13 DIAGNOSIS — Z1231 Encounter for screening mammogram for malignant neoplasm of breast: Secondary | ICD-10-CM

## 2021-09-03 ENCOUNTER — Other Ambulatory Visit: Payer: Self-pay | Admitting: Psychiatry

## 2021-09-03 DIAGNOSIS — F411 Generalized anxiety disorder: Secondary | ICD-10-CM

## 2021-09-03 DIAGNOSIS — F321 Major depressive disorder, single episode, moderate: Secondary | ICD-10-CM

## 2021-12-03 ENCOUNTER — Other Ambulatory Visit: Payer: Self-pay | Admitting: Psychiatry

## 2021-12-03 DIAGNOSIS — F411 Generalized anxiety disorder: Secondary | ICD-10-CM

## 2021-12-03 DIAGNOSIS — F321 Major depressive disorder, single episode, moderate: Secondary | ICD-10-CM

## 2022-07-30 ENCOUNTER — Other Ambulatory Visit: Payer: Self-pay | Admitting: Internal Medicine

## 2022-07-30 DIAGNOSIS — Z1231 Encounter for screening mammogram for malignant neoplasm of breast: Secondary | ICD-10-CM

## 2022-09-13 ENCOUNTER — Ambulatory Visit
Admission: RE | Admit: 2022-09-13 | Discharge: 2022-09-13 | Disposition: A | Discharge status: Home / Self Care | Payer: 59 | Source: Ambulatory Visit | Attending: Internal Medicine | Admitting: Internal Medicine

## 2022-09-13 DIAGNOSIS — Z1231 Encounter for screening mammogram for malignant neoplasm of breast: Secondary | ICD-10-CM

## 2023-08-31 ENCOUNTER — Other Ambulatory Visit: Payer: Self-pay | Admitting: Internal Medicine

## 2023-08-31 DIAGNOSIS — Z1231 Encounter for screening mammogram for malignant neoplasm of breast: Secondary | ICD-10-CM

## 2023-10-25 ENCOUNTER — Ambulatory Visit

## 2023-11-03 ENCOUNTER — Ambulatory Visit

## 2023-11-14 ENCOUNTER — Ambulatory Visit

## 2023-11-21 ENCOUNTER — Ambulatory Visit
Admission: RE | Admit: 2023-11-21 | Discharge: 2023-11-21 | Disposition: A | Discharge status: Home / Self Care | Source: Ambulatory Visit | Attending: Internal Medicine | Admitting: Internal Medicine

## 2023-11-21 DIAGNOSIS — Z1231 Encounter for screening mammogram for malignant neoplasm of breast: Secondary | ICD-10-CM

## 2023-11-24 ENCOUNTER — Other Ambulatory Visit: Payer: Self-pay | Admitting: Internal Medicine

## 2023-11-24 DIAGNOSIS — N644 Mastodynia: Secondary | ICD-10-CM

## 2024-01-04 ENCOUNTER — Ambulatory Visit
Admission: RE | Admit: 2024-01-04 | Discharge: 2024-01-04 | Disposition: A | Discharge status: Home / Self Care | Source: Ambulatory Visit | Attending: Internal Medicine | Admitting: Internal Medicine

## 2024-01-04 DIAGNOSIS — N644 Mastodynia: Secondary | ICD-10-CM
